# Patient Record
Sex: Male | Born: 1935 | Race: White | Hispanic: No | Smoking: Current every day smoker
Health system: Southern US, Community
[De-identification: ages and names within clinical notes are randomized; demographics above are authoritative.]

## PROBLEM LIST (undated history)

## (undated) DIAGNOSIS — L4050 Arthropathic psoriasis, unspecified: Secondary | ICD-10-CM

## (undated) DIAGNOSIS — E785 Hyperlipidemia, unspecified: Secondary | ICD-10-CM

## (undated) DIAGNOSIS — J449 Chronic obstructive pulmonary disease, unspecified: Secondary | ICD-10-CM

## (undated) DIAGNOSIS — L409 Psoriasis, unspecified: Secondary | ICD-10-CM

## (undated) DIAGNOSIS — I10 Essential (primary) hypertension: Secondary | ICD-10-CM

## (undated) DIAGNOSIS — I2510 Atherosclerotic heart disease of native coronary artery without angina pectoris: Secondary | ICD-10-CM

## (undated) HISTORY — PX: TONSILLECTOMY: SUR1361

---

## 2015-05-23 ENCOUNTER — Encounter (HOSPITAL_COMMUNITY)
Admission: AD | Disposition: A | Discharge status: Home / Self Care | Payer: Self-pay | Source: Other Acute Inpatient Hospital | Attending: Interventional Cardiology

## 2015-05-23 ENCOUNTER — Inpatient Hospital Stay (HOSPITAL_COMMUNITY)
Admission: AD | Admit: 2015-05-23 | Discharge: 2015-05-24 | DRG: 281 | Disposition: A | Discharge status: Home / Self Care | Payer: Medicare Other | Source: Other Acute Inpatient Hospital | Attending: Interventional Cardiology | Admitting: Interventional Cardiology

## 2015-05-23 ENCOUNTER — Encounter (HOSPITAL_COMMUNITY): Payer: Self-pay | Admitting: Interventional Cardiology

## 2015-05-23 DIAGNOSIS — L409 Psoriasis, unspecified: Secondary | ICD-10-CM | POA: Diagnosis not present

## 2015-05-23 DIAGNOSIS — E785 Hyperlipidemia, unspecified: Secondary | ICD-10-CM | POA: Diagnosis not present

## 2015-05-23 DIAGNOSIS — F17210 Nicotine dependence, cigarettes, uncomplicated: Secondary | ICD-10-CM | POA: Diagnosis present

## 2015-05-23 DIAGNOSIS — I2510 Atherosclerotic heart disease of native coronary artery without angina pectoris: Secondary | ICD-10-CM | POA: Diagnosis not present

## 2015-05-23 DIAGNOSIS — I214 Non-ST elevation (NSTEMI) myocardial infarction: Principal | ICD-10-CM

## 2015-05-23 DIAGNOSIS — J441 Chronic obstructive pulmonary disease with (acute) exacerbation: Secondary | ICD-10-CM | POA: Diagnosis not present

## 2015-05-23 DIAGNOSIS — R079 Chest pain, unspecified: Secondary | ICD-10-CM | POA: Diagnosis present

## 2015-05-23 DIAGNOSIS — M549 Dorsalgia, unspecified: Secondary | ICD-10-CM

## 2015-05-23 DIAGNOSIS — M1990 Unspecified osteoarthritis, unspecified site: Secondary | ICD-10-CM | POA: Diagnosis not present

## 2015-05-23 DIAGNOSIS — J449 Chronic obstructive pulmonary disease, unspecified: Secondary | ICD-10-CM

## 2015-05-23 DIAGNOSIS — F17200 Nicotine dependence, unspecified, uncomplicated: Secondary | ICD-10-CM

## 2015-05-23 DIAGNOSIS — I10 Essential (primary) hypertension: Secondary | ICD-10-CM | POA: Diagnosis not present

## 2015-05-23 HISTORY — DX: Hyperlipidemia, unspecified: E78.5

## 2015-05-23 HISTORY — DX: Arthropathic psoriasis, unspecified: L40.50

## 2015-05-23 HISTORY — DX: Atherosclerotic heart disease of native coronary artery without angina pectoris: I25.10

## 2015-05-23 HISTORY — DX: Psoriasis, unspecified: L40.9

## 2015-05-23 HISTORY — DX: Chronic obstructive pulmonary disease, unspecified: J44.9

## 2015-05-23 HISTORY — PX: CARDIAC CATHETERIZATION: SHX172

## 2015-05-23 LAB — TSH: TSH: 1.213 u[IU]/mL (ref 0.350–4.500)

## 2015-05-23 LAB — CREATININE, SERUM
Creatinine, Ser: 1.44 mg/dL — ABNORMAL HIGH (ref 0.61–1.24)
GFR calc Af Amer: 51 mL/min — ABNORMAL LOW (ref >60)
GFR calc non Af Amer: 44 mL/min — ABNORMAL LOW (ref >60)

## 2015-05-23 LAB — CBC
HEMATOCRIT: 40.6 % (ref 39.0–52.0)
HEMOGLOBIN: 13.7 g/dL (ref 13.0–17.0)
MCH: 32.2 pg (ref 26.0–34.0)
MCHC: 33.7 g/dL (ref 30.0–36.0)
MCV: 95.3 fL (ref 78.0–100.0)
Platelets: 130 10*3/uL — ABNORMAL LOW (ref 150–400)
RBC: 4.26 MIL/uL (ref 4.22–5.81)
RDW: 12.7 % (ref 11.5–15.5)
WBC: 6.7 10*3/uL (ref 4.0–10.5)

## 2015-05-23 LAB — TROPONIN I: Troponin I: 3.5 ng/mL (ref <0.031)

## 2015-05-23 LAB — BRAIN NATRIURETIC PEPTIDE: B Natriuretic Peptide: 437 pg/mL — ABNORMAL HIGH (ref 0.0–100.0)

## 2015-05-23 SURGERY — LEFT HEART CATH AND CORONARY ANGIOGRAPHY

## 2015-05-23 MED ORDER — HEART ATTACK BOUNCING BOOK
Freq: Once | Status: AC
  Administered 2015-05-23: 21:00:00
  Filled 2015-05-23: qty 1

## 2015-05-23 MED ORDER — ASPIRIN 81 MG PO CHEW: 324.0000 mg | CHEWABLE_TABLET | ORAL | Status: AC

## 2015-05-23 MED ORDER — VERAPAMIL HCL 2.5 MG/ML IV SOLN
INTRAVENOUS | Status: AC
  Filled 2015-05-23: qty 2

## 2015-05-23 MED ORDER — ASPIRIN EC 81 MG PO TBEC
81.0000 mg | DELAYED_RELEASE_TABLET | Freq: Every day | ORAL | Status: DC
  Administered 2015-05-24: 12:00:00 81 mg via ORAL
  Filled 2015-05-23: qty 1

## 2015-05-23 MED ORDER — NICOTINE 14 MG/24HR TD PT24
14.0000 mg | MEDICATED_PATCH | Freq: Every day | TRANSDERMAL | Status: DC
  Administered 2015-05-23: 22:00:00 14 mg via TRANSDERMAL
  Filled 2015-05-23: qty 1

## 2015-05-23 MED ORDER — SODIUM CHLORIDE 0.9 % IJ SOLN: 3.0000 mL | INTRAMUSCULAR | Status: DC | PRN

## 2015-05-23 MED ORDER — ASPIRIN 81 MG PO CHEW: 81.0000 mg | CHEWABLE_TABLET | Freq: Every day | ORAL | Status: DC

## 2015-05-23 MED ORDER — FENTANYL CITRATE (PF) 100 MCG/2ML IJ SOLN
INTRAMUSCULAR | Status: AC
  Filled 2015-05-23: qty 4

## 2015-05-23 MED ORDER — SODIUM CHLORIDE 0.9 % IV SOLN
250.0000 mL | INTRAVENOUS | Status: DC | PRN
Start: 2015-05-23 — End: 2015-05-23

## 2015-05-23 MED ORDER — HEPARIN SODIUM (PORCINE) 1000 UNIT/ML IJ SOLN
INTRAMUSCULAR | Status: DC | PRN
  Administered 2015-05-23: 4500 [IU] via INTRAVENOUS

## 2015-05-23 MED ORDER — SODIUM CHLORIDE 0.9 % IJ SOLN: 3.0000 mL | Freq: Two times a day (BID) | INTRAMUSCULAR | Status: DC

## 2015-05-23 MED ORDER — MORPHINE SULFATE (PF) 2 MG/ML IV SOLN: 2.0000 mg | INTRAVENOUS | Status: DC | PRN

## 2015-05-23 MED ORDER — ONDANSETRON HCL 4 MG/2ML IJ SOLN: 4.0000 mg | Freq: Four times a day (QID) | INTRAMUSCULAR | Status: DC | PRN

## 2015-05-23 MED ORDER — ACETAMINOPHEN 325 MG PO TABS: 650.0000 mg | ORAL_TABLET | ORAL | Status: DC | PRN

## 2015-05-23 MED ORDER — IOHEXOL 350 MG/ML SOLN
INTRAVENOUS | Status: DC | PRN
  Administered 2015-05-23: 100 mL via INTRA_ARTERIAL

## 2015-05-23 MED ORDER — ACTIVE PARTNERSHIP FOR HEALTH OF YOUR HEART BOOK
Freq: Once | Status: AC
  Administered 2015-05-23: 21:00:00
  Filled 2015-05-23: qty 1

## 2015-05-23 MED ORDER — ASPIRIN 300 MG RE SUPP
300.0000 mg | RECTAL | Status: AC
  Filled 2015-05-23: qty 1

## 2015-05-23 MED ORDER — SODIUM CHLORIDE 0.9 % IV SOLN
INTRAVENOUS | Status: DC | PRN
  Administered 2015-05-23: 87 mL/h via INTRAVENOUS

## 2015-05-23 MED ORDER — HEPARIN SODIUM (PORCINE) 1000 UNIT/ML IJ SOLN
INTRAMUSCULAR | Status: AC
  Filled 2015-05-23: qty 1

## 2015-05-23 MED ORDER — MIDAZOLAM HCL 2 MG/2ML IJ SOLN
INTRAMUSCULAR | Status: DC | PRN
Start: 2015-05-23 — End: 2015-05-23
  Administered 2015-05-23: 1 mg via INTRAVENOUS

## 2015-05-23 MED ORDER — NITROGLYCERIN 0.4 MG SL SUBL: 0.4000 mg | SUBLINGUAL_TABLET | SUBLINGUAL | Status: DC | PRN

## 2015-05-23 MED ORDER — MIDAZOLAM HCL 2 MG/2ML IJ SOLN
INTRAMUSCULAR | Status: AC
  Filled 2015-05-23: qty 4

## 2015-05-23 MED ORDER — LIDOCAINE HCL (PF) 1 % IJ SOLN
INTRAMUSCULAR | Status: AC
  Filled 2015-05-23: qty 30

## 2015-05-23 MED ORDER — FENTANYL CITRATE (PF) 100 MCG/2ML IJ SOLN
INTRAMUSCULAR | Status: DC | PRN
  Administered 2015-05-23: 50 ug via INTRAVENOUS

## 2015-05-23 MED ORDER — HEPARIN (PORCINE) IN NACL 2-0.9 UNIT/ML-% IJ SOLN
INTRAMUSCULAR | Status: DC | PRN
  Administered 2015-05-23: 17:00:00

## 2015-05-23 MED ORDER — CARVEDILOL 3.125 MG PO TABS
3.1250 mg | ORAL_TABLET | Freq: Two times a day (BID) | ORAL | Status: DC
  Administered 2015-05-23 – 2015-05-24 (×2): 3.125 mg via ORAL
  Filled 2015-05-23 (×2): qty 1

## 2015-05-23 MED ORDER — VERAPAMIL HCL 2.5 MG/ML IV SOLN
INTRAVENOUS | Status: DC | PRN
  Administered 2015-05-23: 16:00:00 via INTRA_ARTERIAL

## 2015-05-23 MED ORDER — HEPARIN (PORCINE) IN NACL 100-0.45 UNIT/ML-% IJ SOLN
1100.0000 [IU]/h | INTRAMUSCULAR | Status: DC
  Administered 2015-05-24: 04:00:00 1100 [IU]/h via INTRAVENOUS
  Filled 2015-05-23: qty 250

## 2015-05-23 MED ORDER — SODIUM CHLORIDE 0.9 % WEIGHT BASED INFUSION
1.0000 mL/kg/h | INTRAVENOUS | Status: AC
  Administered 2015-05-23: 1 mL/kg/h via INTRAVENOUS

## 2015-05-23 MED ORDER — NITROGLYCERIN IN D5W 200-5 MCG/ML-% IV SOLN
10.0000 ug/min | INTRAVENOUS | Status: DC
  Administered 2015-05-23: 19:00:00 10 ug/min via INTRAVENOUS
  Filled 2015-05-23: qty 250

## 2015-05-23 MED ORDER — ATORVASTATIN CALCIUM 40 MG PO TABS
40.0000 mg | ORAL_TABLET | Freq: Every day | ORAL | Status: DC
  Administered 2015-05-23: 18:00:00 40 mg via ORAL
  Filled 2015-05-23: qty 1

## 2015-05-23 SURGICAL SUPPLY — 10 items
CATH INFINITI 5 FR JL3.5 (CATHETERS) ×3 IMPLANT
CATH INFINITI JR4 5F (CATHETERS) ×3 IMPLANT
DEVICE RAD COMP TR BAND LRG (VASCULAR PRODUCTS) ×3 IMPLANT
GLIDESHEATH SLEND A-KIT 6F 22G (SHEATH) ×3 IMPLANT
KIT HEART LEFT (KITS) ×3 IMPLANT
PACK CARDIAC CATHETERIZATION (CUSTOM PROCEDURE TRAY) ×3 IMPLANT
TRANSDUCER W/STOPCOCK (MISCELLANEOUS) ×3 IMPLANT
TUBING CIL FLEX 10 FLL-RA (TUBING) ×3 IMPLANT
WIRE HI TORQ VERSACORE-J 145CM (WIRE) ×3 IMPLANT
WIRE SAFE-T 1.5MM-J .035X260CM (WIRE) ×3 IMPLANT

## 2015-05-23 NOTE — Progress Notes (Signed)
TR BAND REMOVAL  LOCATION:    right radial  DEFLATED PER PROTOCOL:    Yes.    TIME BAND OFF / DRESSING APPLIED:    1930   SITE UPON ARRIVAL:    Level 0  SITE AFTER BAND REMOVAL:    Level 0  REVERSE ALLEN'S TEST:     positive  CIRCULATION SENSATION AND MOVEMENT:    Within Normal Limits   Yes.    COMMENTS:    

## 2015-05-23 NOTE — Progress Notes (Signed)
ANTICOAGULATION CONSULT NOTE - Initial Consult  Pharmacy Consult for Heparin Indication: chest pain/ACS  Allergies not on file  Patient Measurements: Height:  (180.3 cm) Weight: 192 lb (87.091 kg) IBW/kg (Calculated) : 75.3 Heparin Dosing Weight:   Vital Signs: BP: 130/76 mmHg (10/06 1634) Pulse Rate: 0 (10/06 1639)  Labs: No results for input(s): HGB, HCT, PLT, APTT, LABPROT, INR, HEPARINUNFRC, CREATININE, CKTOTAL, CKMB, TROPONINI in the last 72 hours.  CrCl cannot be calculated (Unknown ideal weight.).   Medical History: Past Medical History  Diagnosis Date  . Psoriasis     Present for greater than 20 years  . Hyperlipidemia   . COPD (chronic obstructive pulmonary disease) (HCC)   . Hypertension   . Arthritis with psoriasis (HCC)     Medications:  No prescriptions prior to admission   Scheduled:  . aspirin  81 mg Oral Daily  . sodium chloride  3 mL Intravenous Q12H   Infusions:  . sodium chloride    . nitroGLYCERIN      Assessment: 81yo male with history of HLD, COPD, HTN and psoriasis presents with 2-3 week history of recurring CP. Pharmacy is consulted to dose heparin for ACS/STEMI. Pt underwent cardiac cath and will start heparin 8h after removal of sheath. Removal was noted by nurse at 1930. Hgb 13.7, Plt 130, sCr 1.44, Trop 3.5, BNP 437.  Goal of Therapy:  Heparin level 0.3-0.7 units/ml Monitor platelets by anticoagulation protocol: Yes   Plan:  Start heparin infusion at 1100 units/hr Check anti-Xa level in 8 hours and daily while on heparin Continue to monitor H&H and platelets  Arlean Hopping. Newman Pies, PharmD Clinical Pharmacist Pager [phone] 05/23/2015,5:09 PM

## 2015-05-23 NOTE — H&P (Signed)
David Velasquez is a 81 y.o. male  Admit Date: 05/23/2015 Referring Physician: Sherril Croon Primary Cardiologist:: Verdis Prime Chief complaint / reason for admission: Recurring chest pain  HPI: 81 year old gentleman with a 2 to three-week history of recurring episodes of chest pressure. Episodes occur spontaneously and previously been lasting up to 15-20 minutes. Today the discomfort started and felt like heartburn and would not go away. The discomfort lasted greater than an hour. Over the past 24 hours he has had at least 3 prolonged episodes of discomfort.    PMH:    Past Medical History  Diagnosis Date  . Psoriasis     Present for greater than 20 years  . Hyperlipidemia   . COPD (chronic obstructive pulmonary disease) (HCC)   . Hypertension   . Arthritis with psoriasis (HCC)     PSH:    Past Surgical History  Procedure Laterality Date  . Tonsillectomy     ALLERGIES:   Review of patient's allergies indicates not on file. Prior to Admit Meds:   No prescriptions prior to admission   Family HX:    Family History  Problem Relation Age of Onset  . Cancer Mother   . Coronary artery disease Father    Social HX:    Social History   Social History  . Marital Status: Unknown    Spouse Name: N/A  . Number of Children: N/A  . Years of Education: N/A   Occupational History  . Not on file.   Social History Main Topics  . Smoking status: Current Every Day Smoker -- 1.50 packs/day for 60 years    Types: Cigarettes  . Smokeless tobacco: Not on file  . Alcohol Use: Yes     Comment: On occasion but not on a daily basis  . Drug Use: No  . Sexual Activity: No   Other Topics Concern  . Not on file   Social History Narrative  . No narrative on file     ROS difficulty with ambulation due to diffuse arthritis. No history of GI or GU bleeding. Appetite is been stable. Weight is been stable. There've been no neurological complaints. He denies claudication.  Physical Exam: BP  147/60, heart rate 80, respiratory rate 16 and nonlabored, afebrile  SpO2 98 %.    Chronically ill-appearing elderly gentleman lying flat on the hospital gurney in no acute distress.  Skin is warm and dry. No nail bed cyanosis is noted. Neck veins not distended. No carotid bruits are heard. Carotid upstroke is 2+ bilateral. Chest is clear to auscultation and percussion. Cardiac exam reveals no gallop, rub, click, murmur. Heart sounds are distant. Abdomen is soft. Bowel sounds are normal. No pulsatile masses are noted. Bowel sounds are 2+. Pulses are 2+ and symmetric in the carotid, radials, femorals bilaterally, and posterior tibials are 1+ bilateral. Unable to feel dorsalis pedis pulses. Neurologically the patient is intact. Able to ambulate. No focal motor deficits are noted.  Labs:  [facility] a creatinine of 1.39, calcium of 9.1, potassium of 4.0, troponin of 0.74, normal white blood cell count of 7500. Hemoglobin is 14.7. Platelet count 147,000.   Radiology:  Checks x-ray done earlier today at [facility] demonstrates no evidence of edema or consolidation. Arteriosclerotic calcification is noted in the aortic arch. Heart size and pulmonary vascularity were felt to be normal.   EKG:  The EKG performed today at 11:05 AM demonstrates an anteroseptal infarct of uncertain age, probably  recent. The precordial and lateral T wave inversions noted.  ASSESSMENT:  1. Recent anteroseptal Q wave myocardial infarction. Recurring episodes of chest pain and elevated troponin raises the question of additional myocardium at risk or other territories with significant obstruction. 2. Tobacco user with COPD 3. Psoriasis 4. Hyperlipidemia 5. Hypertension on chronic therapy with amlodipine and benazepril followed by Dr. Sherril Croon.  Plan:  1. Coronary artery disease with an acute coronary syndrome ongoing. Pain is recent was this morning. Mild  elevation in troponins. With this scenario, the patient was counseled to undergo heart catheterization which may include left left heart evaluation, coronary angiography, and possible percutaneous coronary intervention with stent implantation. The procedural risks and benefits were discussed in detail. The risks discussed included death, stroke, myocardial infarction, life-threatening bleeding, limb ischemia, kidney injury, allergy, and possible emergency cardiac surgery. The risk of these significant complications occurred less than 1% of the time. After discussion, the patient has agreed to proceed. 2. Plan to institute aspirin and anticoagulation therapy. 3. Add beta blocker therapy 4. High intensity statin therapy 5. Further management based upon anatomy may include percutaneous coronary intervention if appropriate.   Lesleigh Noe 05/23/2015 3:50 PM

## 2015-05-24 ENCOUNTER — Other Ambulatory Visit (HOSPITAL_COMMUNITY): Payer: Medicare Other

## 2015-05-24 ENCOUNTER — Encounter (HOSPITAL_COMMUNITY): Payer: Self-pay | Admitting: Interventional Cardiology

## 2015-05-24 DIAGNOSIS — I5022 Chronic systolic (congestive) heart failure: Secondary | ICD-10-CM | POA: Diagnosis not present

## 2015-05-24 DIAGNOSIS — I10 Essential (primary) hypertension: Secondary | ICD-10-CM | POA: Diagnosis present

## 2015-05-24 DIAGNOSIS — I214 Non-ST elevation (NSTEMI) myocardial infarction: Secondary | ICD-10-CM | POA: Diagnosis present

## 2015-05-24 DIAGNOSIS — R079 Chest pain, unspecified: Secondary | ICD-10-CM | POA: Diagnosis present

## 2015-05-24 DIAGNOSIS — E785 Hyperlipidemia, unspecified: Secondary | ICD-10-CM | POA: Diagnosis present

## 2015-05-24 DIAGNOSIS — M1990 Unspecified osteoarthritis, unspecified site: Secondary | ICD-10-CM | POA: Diagnosis present

## 2015-05-24 DIAGNOSIS — I2510 Atherosclerotic heart disease of native coronary artery without angina pectoris: Secondary | ICD-10-CM | POA: Diagnosis present

## 2015-05-24 DIAGNOSIS — L409 Psoriasis, unspecified: Secondary | ICD-10-CM | POA: Diagnosis present

## 2015-05-24 DIAGNOSIS — J441 Chronic obstructive pulmonary disease with (acute) exacerbation: Secondary | ICD-10-CM | POA: Diagnosis present

## 2015-05-24 DIAGNOSIS — F17210 Nicotine dependence, cigarettes, uncomplicated: Secondary | ICD-10-CM | POA: Diagnosis present

## 2015-05-24 LAB — BASIC METABOLIC PANEL
ANION GAP: 8 (ref 5–15)
BUN: 15 mg/dL (ref 6–20)
CHLORIDE: 102 mmol/L (ref 101–111)
CO2: 27 mmol/L (ref 22–32)
Calcium: 8.8 mg/dL — ABNORMAL LOW (ref 8.9–10.3)
Creatinine, Ser: 1.42 mg/dL — ABNORMAL HIGH (ref 0.61–1.24)
GFR calc non Af Amer: 44 mL/min — ABNORMAL LOW (ref >60)
GFR, EST AFRICAN AMERICAN: 52 mL/min — AB (ref >60)
Glucose, Bld: 100 mg/dL — ABNORMAL HIGH (ref 65–99)
Potassium: 3.9 mmol/L (ref 3.5–5.1)
Sodium: 137 mmol/L (ref 135–145)

## 2015-05-24 LAB — LIPID PANEL
Cholesterol: 142 mg/dL (ref 0–200)
HDL: 35 mg/dL — AB (ref >40)
LDL CALC: 96 mg/dL (ref 0–99)
TRIGLYCERIDES: 53 mg/dL (ref <150)
Total CHOL/HDL Ratio: 4.1 RATIO
VLDL: 11 mg/dL (ref 0–40)

## 2015-05-24 LAB — CBC
HEMATOCRIT: 40 % (ref 39.0–52.0)
HEMOGLOBIN: 13.4 g/dL (ref 13.0–17.0)
MCH: 32.1 pg (ref 26.0–34.0)
MCHC: 33.5 g/dL (ref 30.0–36.0)
MCV: 95.9 fL (ref 78.0–100.0)
Platelets: 130 10*3/uL — ABNORMAL LOW (ref 150–400)
RBC: 4.17 MIL/uL — AB (ref 4.22–5.81)
RDW: 12.8 % (ref 11.5–15.5)
WBC: 6.7 10*3/uL (ref 4.0–10.5)

## 2015-05-24 LAB — TROPONIN I
Troponin I: 4.13 ng/mL (ref <0.031)
Troponin I: 3.63 ng/mL (ref <0.031)

## 2015-05-24 MED ORDER — ATORVASTATIN CALCIUM 40 MG PO TABS: 40.0000 mg | ORAL_TABLET | Freq: Every day | ORAL | Status: DC

## 2015-05-24 MED ORDER — LISINOPRIL 5 MG PO TABS
2.5000 mg | ORAL_TABLET | Freq: Every day | ORAL | Status: DC
  Administered 2015-05-24: 12:00:00 2.5 mg via ORAL
  Filled 2015-05-24: qty 1

## 2015-05-24 MED ORDER — LISINOPRIL 2.5 MG PO TABS: 2.5000 mg | ORAL_TABLET | Freq: Every day | ORAL | Status: DC

## 2015-05-24 MED ORDER — CLOPIDOGREL BISULFATE 75 MG PO TABS: 75.0000 mg | ORAL_TABLET | Freq: Every day | ORAL | Status: DC

## 2015-05-24 MED ORDER — NICOTINE 14 MG/24HR TD PT24: 14.0000 mg | MEDICATED_PATCH | Freq: Every day | TRANSDERMAL | Status: DC

## 2015-05-24 MED ORDER — NITROGLYCERIN 0.4 MG SL SUBL: 0.4000 mg | SUBLINGUAL_TABLET | SUBLINGUAL | Status: AC | PRN

## 2015-05-24 MED ORDER — ASPIRIN 81 MG PO TBEC: 81.0000 mg | DELAYED_RELEASE_TABLET | Freq: Every day | ORAL | Status: AC

## 2015-05-24 MED ORDER — CLOPIDOGREL BISULFATE 75 MG PO TABS
75.0000 mg | ORAL_TABLET | Freq: Every day | ORAL | Status: DC
  Administered 2015-05-24: 12:00:00 75 mg via ORAL
  Filled 2015-05-24: qty 1

## 2015-05-24 MED ORDER — CARVEDILOL 3.125 MG PO TABS: 3.1250 mg | ORAL_TABLET | Freq: Two times a day (BID) | ORAL | Status: DC

## 2015-05-24 NOTE — Progress Notes (Signed)
CARDIAC REHAB PHASE I   PRE:  Rate/Rhythm: 57 SB    BP: sitting 131/60    SaO2:   MODE:  Ambulation: 150 ft   POST:  Rate/Rhythm: 81 SR    BP: sitting 139/68     SaO2:   Pt able to ambulate 150 ft with his cane, supervision assist x2. Pt deals with arthritis and has limited walking ability. Denied chest pressure, VSS. To recliner. Discussed NTG use, daily wts and low sodium with pt. His main goal is to be home with his dog and stay out of the hospital. He has Mi book. Pt n/a for CRPII due to lack of revascularization and prob need for palliative. [phone]   Harriet Masson CES, ACSM 05/24/2015 10:19 AM

## 2015-05-24 NOTE — Progress Notes (Signed)
Patient Name: David Velasquez Date of Encounter: 05/24/2015  Primary Cardiologist: Dr. Katrinka Blazing   Principal Problem:   NSTEMI (non-ST elevated myocardial infarction) [facility]) Active Problems:   COPD exacerbation (HCC)   Essential hypertension   Tobacco use disorder   Back pain   Chest pain    SUBJECTIVE  Denies any CP since [facility]. Has chronic SOB due to COPD  CURRENT MEDS . aspirin EC  81 mg Oral Daily  . atorvastatin  40 mg Oral q1800  . carvedilol  3.125 mg Oral BID WC  . nicotine  14 mg Transdermal Daily    OBJECTIVE  Filed Vitals:   05/23/15 2200 05/24/15 0000 05/24/15 0200 05/24/15 0420  BP: 112/51 125/58 120/50 123/46  Pulse:      Temp:    97.9 F (36.6 C)  TempSrc:    Oral  Resp: Height:      Weight:    181 lb (82.1 kg)  SpO2: 96% 98% 96% 96%    Intake/Output Summary (Last 24 hours) at 05/24/15 0659 Last data filed at 05/24/15 0651  Gross per 24 hour  Intake  447.8 ml  Output    275 ml  Net  172.8 ml   Filed Weights   05/23/15 1700 05/24/15 0420  Weight: 192 lb (87.091 kg) 181 lb (82.1 kg)    PHYSICAL EXAM  General: Pleasant, NAD. Neuro: Alert and oriented X 3. Moves all extremities spontaneously. Psych: Normal affect. HEENT:  Normal  Neck: Supple without bruits or JVD. Lungs:  Resp regular and unlabored, CTA. Heart: RRR no s3, s4, or murmurs. R radial cath site stable, good distal pulse Abdomen: Soft, non-tender, non-distended, BS + x 4.  Extremities: No clubbing, cyanosis or edema. DP/PT/Radials 2+ and equal bilaterally.  Accessory Clinical Findings  CBC  Recent Labs  05/23/15 1755 05/24/15 0538  WBC 6.7 6.7  HGB 13.7 13.4  HCT 40.6 40.0  MCV 95.3 95.9  PLT 130* 130*   Basic Metabolic Panel  Recent Labs  05/23/15 1755 05/24/15 0538  NA  --  137  K  --  3.9  CL  --  102  CO2  --  27  GLUCOSE  --  100*  BUN  --  15  CREATININE 1.44* 1.42*  CALCIUM  --  8.8*   Cardiac Enzymes  Recent Labs  05/23/15 1755 05/23/15 2308 05/24/15 0538  TROPONINI 3.50* 4.13* 3.63*   Fasting Lipid Panel  Recent Labs  05/24/15 0538  CHOL 142  HDL 35*  LDLCALC 96  TRIG 53  CHOLHDL 4.1   Thyroid Function Tests  Recent Labs  05/23/15 1755  TSH 1.213    TELE NSR without significant ventricular ectopy    ECG  NSR with TWI in anterolateral leads  Echocardiogram  pending    Cath 05/23/2015  Conclusion    1. Prox RCA lesion, 85% stenosed. 2. Dist RCA lesion, 70% stenosed. 3. Mid RCA to Dist RCA lesion, 50% stenosed. 4. Ramus-1 lesion, 80% stenosed. 5. Ramus-2 lesion, 90% stenosed. 6. Dist Cx-1 lesion, 80% stenosed. 7. Dist Cx-2 lesion, 80% stenosed. 8. 2nd Mrg lesion, 70% stenosed. 9. Prox LAD to Mid LAD lesion, 99% stenosed. 10. Ost 1st Diag lesion, 90% stenosed. 11. 1st Diag lesion, 70% stenosed.   Severe, calcified and ectatic, three-vessel coronary disease with subtotal occlusion of the proximal to mid LAD, high-grade obstruction first diagonal, moderately severe large second obtuse marginal, significant distal circumflex, and high-grade proximal RCA with  moderate to severe distal disease.  Moderately severe left ventricular dysfunction with regional wall motion abnormality involving the mid anterior to inferoapical segment. Estimated ejection fraction 35%.   RECOMMENDATIONS:   IV heparin  IV nitroglycerin  Beta blocker as tolerated  Aspirin  Surgical revascularization should be considered. Consult called to TCTS.      Radiology/Studies  No results found.  ASSESSMENT AND PLAN  1. NSTEMI with recurring CP with evidence of recent Q wave infarction  - cath 05/23/2015 severe 3v dx with subtotal occlusion 99% of prox to mid LAD, 80% D1, 80 OM1, 90% OM2, 85% prox RCA. EF 35-45%.   - continue IV heparin, pending echo  - continue ASA, statin, coreg. Continue IV nitro. With prox 99% LAD stenosis, if has recurrent CP, low threshold to transfer to  stepdown. Close monitoring with EKG with CP  - CT surgery consulted, however pt states he will refuse any surgery, however may or may not agree to PCI, will discuss with MD, only alternative is palliative medical therapy vs potential PCI. Per pt, "i am ready to die", adamant that he would not go through with any surgery.  - Still full code, asked the patient code status this morning, discussed CPR and resuscitation, he says there is not point of changing it since he won't stay here is the hospital long enough.    2. HLD 3. HTN 4. Tobacco abuse 5. COPD 6. Possible chronic vs acute renal insufficiency stage III  - Cr 1.4, likely chronic   Signed, Amedeo Plenty Pager: [phone]  I have seen and examined the patient along with Azalee Course PA-C.  I have reviewed the chart, notes and new data.  I agree with PA's note.  Key new complaints: no angina or dyspnea Key examination changes: no clinical HF   PLAN: Discussed his current medical condition and our recommendations for therapy in detail and at length. Under no circumstances will he consider any surgical procedures or even PCI (even if that was a valid option). He repeatedly states that he would rather die at home and is not afraid to die and reports that he has poor QOL due to arthritis, COPD and psoriasis. Dr. Katrinka Blazing came to discuss care options as well. He could not be swayed. He insists on leaving today. He understands the immediate and long term risk of foregoing revascularization. Will treat with DAPT, beta blcokers, ACEi, statin, prn nitrates and arrange f/u in [place].  Thurmon Fair, MD, [facility] [phone] 05/24/2015, 9:17 AM

## 2015-05-24 NOTE — Discharge Instructions (Signed)
No driving for 24 hours. No lifting over 5 lbs for 1 week. No sexual activity for 1 week. Keep procedure site clean & dry. If you notice increased pain, swelling, bleeding or pus, call/return!  You may shower, but no soaking baths/hot tubs/pools for 1 week.   STOP TAKING LOTREL (AMLODIPINE-BENAZEPRIL)

## 2015-05-24 NOTE — Progress Notes (Addendum)
Interventional Cardiology   The patient was interviewed along with Dr. Royann Shivers.  He has surgical anatomy. This was re-discussed. He voiced his very strong opinion that he would not accept coronary bypass surgery as a treatment option. He was less definite about PCI but insistent upon going home even after description of high risk and possible death with medical therapy.  He would not allow any detailed discussion of PCI options.  After clear conversation with the patient, he has chosen conservative medical therapy rather than invasive treatment options.

## 2015-05-24 NOTE — Discharge Summary (Signed)
Discharge Summary   Patient ID: David Velasquez,  MRN: [id number], DOB/AGE: [birth year] 81 y.o.  Admit date: 05/23/2015 Discharge date: 05/24/2015  Primary Care Provider: VYAS,DHRUV B. Primary Cardiologist: Jonita Albee (seen by Dr. Katrinka Blazing during this admission)  Discharge Diagnoses Principal Problem:   NSTEMI (non-ST elevated myocardial infarction) [facility]) Active Problems:   COPD exacerbation (HCC)   Essential hypertension   Tobacco use disorder   Back pain   Chest pain   Allergies No Known Allergies  Procedures  Cardiac catheterization 05/24/2015 Conclusion    1. Prox RCA lesion, 85% stenosed. 2. Dist RCA lesion, 70% stenosed. 3. Mid RCA to Dist RCA lesion, 50% stenosed. 4. Ramus-1 lesion, 80% stenosed. 5. Ramus-2 lesion, 90% stenosed. 6. Dist Cx-1 lesion, 80% stenosed. 7. Dist Cx-2 lesion, 80% stenosed. 8. 2nd Mrg lesion, 70% stenosed. 9. Prox LAD to Mid LAD lesion, 99% stenosed. 10. Ost 1st Diag lesion, 90% stenosed. 11. 1st Diag lesion, 70% stenosed.   Severe, calcified and ectatic, three-vessel coronary disease with subtotal occlusion of the proximal to mid LAD, high-grade obstruction first diagonal, moderately severe large second obtuse marginal, significant distal circumflex, and high-grade proximal RCA with moderate to severe distal disease.  Moderately severe left ventricular dysfunction with regional wall motion abnormality involving the mid anterior to inferoapical segment. Estimated ejection fraction 35%.   RECOMMENDATIONS:   IV heparin  IV nitroglycerin  Beta blocker as tolerated  Aspirin  Surgical revascularization should be considered. Consult called to TCTS.      Hospital Course  The patient is an 81 year old male with PMH of tobacco abuse, COPD, hypertension and hyperlipidemia who has been having recurrent episode of chest pain for the past 2-3 weeks. EKG showed anteroseptal Q waves concerning for myocardial infarction. After discussing the  options, he agreed to undergo diagnostic cardiac catheterization.  He underwent a scheduled study on 05/23/2015 which showed severe 3v dx with subtotal occlusion 99% of prox to mid LAD, 80% D1, 80 OM1, 90% OM2, 85% prox RCA. EF 35-45%. Echocardiogram is currently pending. Multiple people include Dr. Royann Shivers, me, and Dr. Katrinka Blazing also spoke to the patient, however he refused to consider any surgery or event PCI. He insisted on going home. Immediate and long-term risk for his condition has been discussed with the patient including sudden cardiac death, he understood the risk and states "he is ready to die" and again insist on going home. He previously has a follow-up arranged with Dr. Diona Browner in [place] [facility] on 10/20, however given his risk of sudden cardiac death, I have arranged a closer follow-up with Dr. Wyline Mood in recent office early next week. Unfortunately, his prognosis is very poor given his unwillingness to seek further medical therapy. However, we had no choice but to respect his wishes.   His daughter is coming to pick him up, we will give him his plavix if his daughter cannot convince him either.    Discharge Vitals Blood pressure 119/53, pulse 67, temperature 97.8 F (36.6 C), temperature source Oral, resp. rate 18, height  (1.803 m), weight 181 lb (82.1 kg), SpO2 97 %.  Filed Weights   05/23/15 1700 05/24/15 0420  Weight: 192 lb (87.091 kg) 181 lb (82.1 kg)    Labs  CBC  Recent Labs  05/23/15 1755 05/24/15 0538  WBC 6.7 6.7  HGB 13.7 13.4  HCT 40.6 40.0  MCV 95.3 95.9  PLT 130* 130*   Basic Metabolic Panel  Recent Labs  05/23/15 1755 05/24/15 0538  NA  --  137  K  --  3.9  CL  --  102  CO2  --  27  GLUCOSE  --  100*  BUN  --  15  CREATININE 1.44* 1.42*  CALCIUM  --  8.8*   Cardiac Enzymes  Recent Labs  05/23/15 1755 05/23/15 2308 05/24/15 0538  TROPONINI 3.50* 4.13* 3.63*   Fasting Lipid Panel  Recent Labs  05/24/15 0538  CHOL 142  HDL 35*    LDLCALC 96  TRIG 53  CHOLHDL 4.1   Thyroid Function Tests  Recent Labs  05/23/15 1755  TSH 1.213    Disposition  Pt is being discharged home today in good condition.  Follow-up Plans & Appointments      Follow-up Information    Follow up with Dina Rich, MD On 05/28/2015.   Specialty:  Cardiology   Why:  11:20am   Contact information:   [address] [place] [address] [phone]       Discharge Medications    Medication List    STOP taking these medications        amLODipine-benazepril 10-40 MG capsule  Commonly known as:  LOTREL      TAKE these medications        albuterol 108 (90 BASE) MCG/ACT inhaler  Commonly known as:  PROVENTIL HFA;VENTOLIN HFA  Inhale 1 puff into the lungs every 6 (six) hours as needed for wheezing or shortness of breath.     aspirin 81 MG EC tablet  Take 1 tablet (81 mg total) by mouth daily.     atorvastatin 40 MG tablet  Commonly known as:  LIPITOR  Take 1 tablet (40 mg total) by mouth daily at 6 PM.     carvedilol 3.125 MG tablet  Commonly known as:  COREG  Take 1 tablet (3.125 mg total) by mouth 2 (two) times daily with a meal.     clopidogrel 75 MG tablet  Commonly known as:  PLAVIX  Take 1 tablet (75 mg total) by mouth daily.     lisinopril 2.5 MG tablet  Commonly known as:  PRINIVIL,ZESTRIL  Take 1 tablet (2.5 mg total) by mouth daily.     NEXIUM 40 MG capsule  Generic drug:  esomeprazole  Take 40 mg by mouth daily.     nicotine 14 mg/24hr patch  Commonly known as:  NICODERM CQ - dosed in mg/24 hours  Place 1 patch (14 mg total) onto the skin daily.     nitroGLYCERIN 0.4 MG SL tablet  Commonly known as:  NITROSTAT  Place 1 tablet (0.4 mg total) under the tongue every 5 (five) minutes x 3 doses as needed for chest pain.     triamcinolone cream 0.1 %  Commonly known as:  KENALOG  Apply 1 application topically daily as needed. rash         Duration of Discharge Encounter   Greater  than 30 minutes including physician time.  Ramond Dial PA-C Pager: [phone] 05/24/2015, 10:05 AM

## 2015-05-24 NOTE — Progress Notes (Signed)
CM spoke with Selena Batten CM ([facility]) @ [phone] referral made regarding patient disease and condition management education, along with medication management issues for f/u post d/c needs.  Gae Gallop RN,BSN,CM [phone]

## 2015-05-27 ENCOUNTER — Encounter: Payer: Self-pay | Admitting: Physician Assistant

## 2015-05-28 ENCOUNTER — Encounter: Payer: Medicare Other | Admitting: Cardiology

## 2015-05-28 ENCOUNTER — Encounter: Payer: Self-pay | Admitting: Cardiology

## 2015-05-28 NOTE — Progress Notes (Signed)
Patient ID: David Velasquez, male   DOB: 1931/03/02, 81 y.o.   MRN: [id number]     Clinical Summary David Velasquez is a 81 y.o.male seen today for hospital follow up appointment, this is our first visit together.  1. CAD - admit 05/2015 with NSTEMI. Cath showed severe 3 vessel  - patient refused intervention  Past Medical History  Diagnosis Date  . Psoriasis     Present for greater than 20 years  . Hyperlipidemia   . COPD (chronic obstructive pulmonary disease) (HCC)   . Hypertension   . Arthritis with psoriasis (HCC)   . CAD (coronary artery disease)     cath 05/23/2015 3v dx include 99% prox LAD. Pt referred for CT surgery, absolutely refused any surgery, discussed PCI however also refused, adamant about going home     No Known Allergies   Current Outpatient Prescriptions  Medication Sig Dispense Refill  . albuterol (PROVENTIL HFA;VENTOLIN HFA) 108 (90 BASE) MCG/ACT inhaler Inhale 1 puff into the lungs every 6 (six) hours as needed for wheezing or shortness of breath.    Marland Kitchen aspirin EC 81 MG EC tablet Take 1 tablet (81 mg total) by mouth daily.    Marland Kitchen atorvastatin (LIPITOR) 40 MG tablet Take 1 tablet (40 mg total) by mouth daily at 6 PM. 30 tablet 11  . carvedilol (COREG) 3.125 MG tablet Take 1 tablet (3.125 mg total) by mouth 2 (two) times daily with a meal. 60 tablet 5  . clopidogrel (PLAVIX) 75 MG tablet Take 1 tablet (75 mg total) by mouth daily. 30 tablet 11  . lisinopril (PRINIVIL,ZESTRIL) 2.5 MG tablet Take 1 tablet (2.5 mg total) by mouth daily. 30 tablet 11  . NEXIUM 40 MG capsule Take 40 mg by mouth daily.    . nicotine (NICODERM CQ - DOSED IN MG/24 HOURS) 14 mg/24hr patch Place 1 patch (14 mg total) onto the skin daily. 28 patch 0  . nitroGLYCERIN (NITROSTAT) 0.4 MG SL tablet Place 1 tablet (0.4 mg total) under the tongue every 5 (five) minutes x 3 doses as needed for chest pain. 25 tablet 4  . triamcinolone cream (KENALOG) 0.1 % Apply 1 application topically daily as needed.  rash     No current facility-administered medications for this visit.     Past Surgical History  Procedure Laterality Date  . Tonsillectomy    . Cardiac catheterization N/A 05/23/2015    Procedure: Left Heart Cath and Coronary Angiography;  Surgeon: Lyn Records, MD;  Location: [facility] INVASIVE CV LAB;  Service: Cardiovascular;  Laterality: N/A;     No Known Allergies    Family History  Problem Relation Age of Onset  . Cancer Mother   . Coronary artery disease Father      Social History David Velasquez reports that he has been smoking Cigarettes.  He has a 90 pack-year smoking history. He does not have any smokeless tobacco history on file. David Velasquez reports that he drinks alcohol.   Review of Systems CONSTITUTIONAL: No weight loss, fever, chills, weakness or fatigue.  HEENT: Eyes: No visual loss, blurred vision, double vision or yellow sclerae.No hearing loss, sneezing, congestion, runny nose or sore throat.  SKIN: No rash or itching.  CARDIOVASCULAR:  RESPIRATORY: No shortness of breath, cough or sputum.  GASTROINTESTINAL: No anorexia, nausea, vomiting or diarrhea. No abdominal pain or blood.  GENITOURINARY: No burning on urination, no polyuria NEUROLOGICAL: No headache, dizziness, syncope, paralysis, ataxia, numbness or tingling in the extremities. No change in bowel  or bladder control.  MUSCULOSKELETAL: No muscle, back pain, joint pain or stiffness.  LYMPHATICS: No enlarged nodes. No history of splenectomy.  PSYCHIATRIC: No history of depression or anxiety.  ENDOCRINOLOGIC: No reports of sweating, cold or heat intolerance. No polyuria or polydipsia.  Marland Kitchen   Physical Examination There were no vitals filed for this visit. There were no vitals filed for this visit.  Gen: resting comfortably, no acute distress HEENT: no scleral icterus, pupils equal round and reactive, no palptable cervical adenopathy,  CV Resp: Clear to auscultation bilaterally GI: abdomen is soft,  non-tender, non-distended, normal bowel sounds, no hepatosplenomegaly MSK: extremities are warm, no edema.  Skin: warm, no rash Neuro:  no focal deficits Psych: appropriate affect   Diagnostic Studies  05/2015 cath 1. Prox RCA lesion, 85% stenosed. 2. Dist RCA lesion, 70% stenosed. 3. Mid RCA to Dist RCA lesion, 50% stenosed. 4. Ramus-1 lesion, 80% stenosed. 5. Ramus-2 lesion, 90% stenosed. 6. Dist Cx-1 lesion, 80% stenosed. 7. Dist Cx-2 lesion, 80% stenosed. 8. 2nd Mrg lesion, 70% stenosed. 9. Prox LAD to Mid LAD lesion, 99% stenosed. 10. Ost 1st Diag lesion, 90% stenosed. 11. 1st Diag lesion, 70% stenosed.   Severe, calcified and ectatic, three-vessel coronary disease with subtotal occlusion of the proximal to mid LAD, high-grade obstruction first diagonal, moderately severe large second obtuse marginal, significant distal circumflex, and high-grade proximal RCA with moderate to severe distal disease.  Moderately severe left ventricular dysfunction with regional wall motion abnormality involving the mid anterior to inferoapical segment. Estimated ejection fraction 35%.      Assessment and Plan        David Velasquez, M.D., F.A.C.C.

## 2015-06-06 ENCOUNTER — Ambulatory Visit: Payer: Self-pay | Admitting: Cardiology

## 2015-08-08 ENCOUNTER — Ambulatory Visit (INDEPENDENT_AMBULATORY_CARE_PROVIDER_SITE_OTHER): Payer: Medicare Other | Admitting: Cardiovascular Disease

## 2015-08-08 ENCOUNTER — Encounter: Payer: Self-pay | Admitting: Cardiovascular Disease

## 2015-08-08 VITALS — BP 150/68 | HR 57 | Ht 70.0 in | Wt 200.0 lb

## 2015-08-08 DIAGNOSIS — I25118 Atherosclerotic heart disease of native coronary artery with other forms of angina pectoris: Secondary | ICD-10-CM | POA: Diagnosis not present

## 2015-08-08 DIAGNOSIS — I5022 Chronic systolic (congestive) heart failure: Secondary | ICD-10-CM

## 2015-08-08 DIAGNOSIS — I10 Essential (primary) hypertension: Secondary | ICD-10-CM | POA: Diagnosis not present

## 2015-08-08 DIAGNOSIS — E785 Hyperlipidemia, unspecified: Secondary | ICD-10-CM

## 2015-08-08 MED ORDER — LISINOPRIL 5 MG PO TABS: 5.0000 mg | ORAL_TABLET | Freq: Every day | ORAL | Status: DC

## 2015-08-08 NOTE — Progress Notes (Signed)
Patient ID: David KentConnie M Velasquez, male   DOB: 06/23/1931, 81 y.o.   MRN: [id number]      SUBJECTIVE: The patient is an 81 year old male with a PMH of tobacco abuse, COPD, hypertension and hyperlipidemia who had been having recurrent episode of chest pain in 04/2015-05/2015. EKG showed anteroseptal Q waves concerning for myocardial infarction. After discussing the options, he agreed to undergo diagnostic cardiac catheterization.  He underwent a scheduled study on 05/23/2015 which showed severe 3v dx with subtotal occlusion 99% of prox to mid LAD, 80% D1, 80 OM1, 90% OM2, 85% prox RCA. EF 35-45% by LV gram.   Multiple people including Dr. Royann Shiversroitoru, [facility] Meng PA-C, and Dr. Katrinka BlazingSmith also spoke to the patient. However he refused to consider any surgery or even PCI. He insisted on going home. Immediate and long-term risks for his condition were discussed with the patient including sudden cardiac death. He understood the risk and stated "I am ready to die" and again insisted on going home.  He presents for follow up. He is here with his daughter, David Velasquez.  He denies chest pain and leg swelling. He has chronic exertional dyspnea from years of tobacco use and COPD but this has not gotten any worse. He denies dizziness and syncope. His primary complaint relates to weak ankles. He has not had use sublingual nitroglycerin.   Review of Systems: As per "subjective", otherwise negative.  No Known Allergies  Current Outpatient Prescriptions  Medication Sig Dispense Refill  . albuterol (PROVENTIL HFA;VENTOLIN HFA) 108 (90 BASE) MCG/ACT inhaler Inhale 1 puff into the lungs every 6 (six) hours as needed for wheezing or shortness of breath.    Marland Kitchen. aspirin EC 81 MG EC tablet Take 1 tablet (81 mg total) by mouth daily.    Marland Kitchen. atorvastatin (LIPITOR) 40 MG tablet Take 1 tablet (40 mg total) by mouth daily at 6 PM. 30 tablet 11  . carvedilol (COREG) 3.125 MG tablet Take 1 tablet (3.125 mg total) by mouth 2 (two) times daily with a  meal. 60 tablet 5  . clopidogrel (PLAVIX) 75 MG tablet Take 1 tablet (75 mg total) by mouth daily. 30 tablet 11  . lisinopril (PRINIVIL,ZESTRIL) 2.5 MG tablet Take 1 tablet (2.5 mg total) by mouth daily. 30 tablet 11  . nitroGLYCERIN (NITROSTAT) 0.4 MG SL tablet Place 1 tablet (0.4 mg total) under the tongue every 5 (five) minutes x 3 doses as needed for chest pain. 25 tablet 4  . triamcinolone cream (KENALOG) 0.1 % Apply 1 application topically daily as needed. rash     No current facility-administered medications for this visit.    Past Medical History  Diagnosis Date  . Psoriasis     Present for greater than 20 years  . Hyperlipidemia   . COPD (chronic obstructive pulmonary disease) (HCC)   . Hypertension   . Arthritis with psoriasis (HCC)   . CAD (coronary artery disease)     cath 05/23/2015 3v dx include 99% prox LAD. Pt referred for CT surgery, absolutely refused any surgery, discussed PCI however also refused, adamant about going home    Past Surgical History  Procedure Laterality Date  . Tonsillectomy    . Cardiac catheterization N/A 05/23/2015    Procedure: Left Heart Cath and Coronary Angiography;  Surgeon: Lyn RecordsHenry W Smith, MD;  [facility];  Service: Cardiovascular;  Laterality: N/A;    Social History   Social History  . Marital Status: Unknown    Spouse Name: N/A  .  Number of Children: N/A  . Years of Education: N/A   Occupational History  . Not on file.   Social History Main Topics  . Smoking status: Former Smoker -- 1.50 packs/day for 60 years    Types: Cigarettes    Quit date: 06/08/2015  . Smokeless tobacco: Never Used  . Alcohol Use: 0.0 oz/week    0 Standard drinks or equivalent per week     Comment: On occasion but not on a daily basis  . Drug Use: No  . Sexual Activity: No   Other Topics Concern  . Not on file   Social History Narrative     Filed Vitals:   08/08/15 0916  BP: 150/68  Pulse: 57  Height:  (1.778 m)    Weight: 200 lb (90.719 kg)  SpO2: 98%    PHYSICAL EXAM General: NAD HEENT: Normal. Neck: No JVD, no thyromegaly. Lungs: No rales or wheezes. CV: Regular rate and rhythm, normal S1/S2, no S3/S4, no murmur. No pretibial or periankle edema.  Abdomen: Soft, nontender, no distention.  Neurologic: Alert and oriented x 3.  Psych: Normal affect. Skin: Normal. Musculoskeletal: No gross deformities. Extremities: No clubbing or cyanosis.   ECG: Most recent ECG reviewed.      ASSESSMENT AND PLAN: 1. Severe CAD: Prognosis is poor without revascularization, which he is not interested in. Symptomatically stable. Continue ASA, Plavix, Lipitor, Coreg, and ACEI.  2. Essential HTN: Elevated. Increase lisinopril to 5 mg daily.  3. Hyperlipidemia: On Lipitor 40 mg.  4. Chronic systolic heart failure: Euvolemic. No diuretic requirement at present. Continue Coreg and ACEI.  Dispo: f/u 3-4 months.   Prentice Docker, M.D., F.A.C.C.

## 2015-08-08 NOTE — Patient Instructions (Signed)
Your physician recommends that you schedule a follow-up appointment in: 3-4 MONTHS WITH DR. Purvis SheffieldKONESWARAN  Your physician has recommended you make the following change in your medication:   INCREASE LISINOPRIL 5 MG DAILY  Thank you for choosing [facility]!!

## 2015-11-04 ENCOUNTER — Other Ambulatory Visit: Payer: Self-pay

## 2015-11-04 MED ORDER — CARVEDILOL 3.125 MG PO TABS: 3.1250 mg | ORAL_TABLET | Freq: Two times a day (BID) | ORAL | Status: DC

## 2015-11-07 ENCOUNTER — Ambulatory Visit: Payer: Medicare Other | Admitting: Cardiovascular Disease

## 2015-11-27 ENCOUNTER — Ambulatory Visit (INDEPENDENT_AMBULATORY_CARE_PROVIDER_SITE_OTHER): Payer: Medicare Other | Admitting: Cardiovascular Disease

## 2015-11-27 ENCOUNTER — Encounter: Payer: Self-pay | Admitting: Cardiovascular Disease

## 2015-11-27 VITALS — BP 171/85 | HR 60 | Ht 70.0 in | Wt 202.0 lb

## 2015-11-27 DIAGNOSIS — I25118 Atherosclerotic heart disease of native coronary artery with other forms of angina pectoris: Secondary | ICD-10-CM | POA: Diagnosis not present

## 2015-11-27 DIAGNOSIS — I5022 Chronic systolic (congestive) heart failure: Secondary | ICD-10-CM | POA: Diagnosis not present

## 2015-11-27 DIAGNOSIS — E785 Hyperlipidemia, unspecified: Secondary | ICD-10-CM | POA: Diagnosis not present

## 2015-11-27 DIAGNOSIS — Z720 Tobacco use: Secondary | ICD-10-CM

## 2015-11-27 DIAGNOSIS — I10 Essential (primary) hypertension: Secondary | ICD-10-CM

## 2015-11-27 MED ORDER — AMLODIPINE BESYLATE 2.5 MG PO TABS: 2.5000 mg | ORAL_TABLET | Freq: Every day | ORAL | Status: DC

## 2015-11-27 NOTE — Progress Notes (Signed)
Patient ID: David Velasquez, male   DOB: 05/05/1931, 82 y.o.   MRN: [id number]      SUBJECTIVE: The patient presents for follow-up for severe coronary artery disease. I started lisinopril at his last visit for elevated blood pressure but he did not tolerate it.  Coronary angiography on 05/23/2015 which showed severe 3 vessel disease with subtotal occlusion 99% of proximal to mid LAD, 80% D1, 80 OM1, 90% OM2, 85% prox RCA. EF 35-45% by LV gram.   Multiple people including Dr. Royann Shiversroitoru, [facility] Meng PA-C, and Dr. Katrinka BlazingSmith also spoke to the patient. However he refused to consider any surgery or even PCI. He insisted on going home. Immediate and long-term risks for his condition were discussed with the patient including sudden cardiac death. He understood the risk and stated "I am ready to die" and again insisted on going home.  He denies chest pain and leg swelling. He has chronic exertional dyspnea from years of tobacco use and COPD but this has not gotten any worse.   He continues to smoke.  Review of Systems: As per "subjective", otherwise negative.  No Known Allergies  Current Outpatient Prescriptions  Medication Sig Dispense Refill  . albuterol (PROVENTIL HFA;VENTOLIN HFA) 108 (90 BASE) MCG/ACT inhaler Inhale 1 puff into the lungs every 6 (six) hours as needed for wheezing or shortness of breath.    Marland Kitchen. aspirin EC 81 MG EC tablet Take 1 tablet (81 mg total) by mouth daily.    Marland Kitchen. atorvastatin (LIPITOR) 40 MG tablet Take 1 tablet (40 mg total) by mouth daily at 6 PM. 30 tablet 11  . carvedilol (COREG) 3.125 MG tablet Take 1 tablet (3.125 mg total) by mouth 2 (two) times daily with a meal. 60 tablet 6  . clopidogrel (PLAVIX) 75 MG tablet Take 1 tablet (75 mg total) by mouth daily. 30 tablet 11  . co-enzyme Q-10 50 MG capsule Take 50 mg by mouth daily.    . magnesium oxide (MAG-OX) 400 MG tablet Take 400 mg by mouth daily.    . nitroGLYCERIN (NITROSTAT) 0.4 MG SL tablet Place 1 tablet (0.4 mg total)  under the tongue every 5 (five) minutes x 3 doses as needed for chest pain. 25 tablet 4  . Specialty Vitamins Products (PROSTATE PO) Take 1 capsule by mouth daily.    Marland Kitchen. triamcinolone cream (KENALOG) 0.1 % Apply 1 application topically daily as needed. rash     No current facility-administered medications for this visit.    Past Medical History  Diagnosis Date  . Psoriasis     Present for greater than 20 years  . Hyperlipidemia   . COPD (chronic obstructive pulmonary disease) (HCC)   . Hypertension   . Arthritis with psoriasis (HCC)   . CAD (coronary artery disease)     cath 05/23/2015 3v dx include 99% prox LAD. Pt referred for CT surgery, absolutely refused any surgery, discussed PCI however also refused, adamant about going home    Past Surgical History  Procedure Laterality Date  . Tonsillectomy    . Cardiac catheterization N/A 05/23/2015    Procedure: Left Heart Cath and Coronary Angiography;  Surgeon: Lyn RecordsHenry W Smith, MD;  [facility];  Service: Cardiovascular;  Laterality: N/A;    Social History   Social History  . Marital Status: Unknown    Spouse Name: N/A  . Number of Children: N/A  . Years of Education: N/A   Occupational History  . Not on file.   Social  History Main Topics  . Smoking status: Current Every Day Smoker -- 1.50 packs/day for 60 years    Types: Cigarettes    Start date: 10/20/1947  . Smokeless tobacco: Never Used  . Alcohol Use: 0.0 oz/week    0 Standard drinks or equivalent per week     Comment: On occasion but not on a daily basis  . Drug Use: No  . Sexual Activity: No   Other Topics Concern  . Not on file   Social History Narrative     Filed Vitals:   11/27/15 1446  BP: 171/85  Pulse: 60  Height:  (1.778 m)  Weight: 202 lb (91.627 kg)    PHYSICAL EXAM General: NAD HEENT: Normal. Neck: No JVD, no thyromegaly. Lungs: No rales or wheezes. CV: Regular rate and rhythm, normal S1/S2, no S3/S4, no murmur. No  pretibial or periankle edema. Abdomen: Soft, nontender, no distention.  Neurologic: Alert and oriented x 3.  Psych: Flat affect. Skin: Normal. Extremities: No clubbing or cyanosis.   ECG: Most recent ECG reviewed.    ASSESSMENT AND PLAN: 1. Severe CAD: Prognosis is poor without revascularization, which he is not interested in. Symptomatically stable. Continue ASA, Plavix, Lipitor, and Coreg. Will start amlodipine 2.5 mg to control BP.  2. Essential HTN: Elevated. I started lisinopril 5 mg daily but he did not tolerate it. Will start amlodipine 2.5 mg.  3. Hyperlipidemia: On Lipitor 40 mg.  4. Chronic systolic heart failure: Euvolemic. No diuretic requirement at present. Continue Coreg.  Dispo: f/u 6 months.   Prentice Docker, M.D., F.A.C.C.

## 2015-11-27 NOTE — Patient Instructions (Signed)
Your physician has recommended you make the following change in your medication:  Start amlodipine 2.5 mg daily. Continue all other medications the same. Your physician recommends that you schedule a follow-up appointment in: 6 months. You will receive a reminder letter in the mail in about 4 months reminding you to call and schedule your appointment. If you don't receive this letter, please contact our office.

## 2016-05-14 NOTE — Progress Notes (Signed)
This encounter was created in error - please disregard.

## 2016-05-18 ENCOUNTER — Other Ambulatory Visit: Payer: Self-pay | Admitting: Physician Assistant

## 2016-05-19 NOTE — Telephone Encounter (Signed)
Review for refill. 

## 2016-06-17 ENCOUNTER — Other Ambulatory Visit: Payer: Self-pay | Admitting: Cardiovascular Disease

## 2016-07-13 ENCOUNTER — Inpatient Hospital Stay (HOSPITAL_BASED_OUTPATIENT_CLINIC_OR_DEPARTMENT_OTHER): Payer: Medicare Other

## 2016-07-13 ENCOUNTER — Emergency Department (HOSPITAL_COMMUNITY): Payer: Medicare Other

## 2016-07-13 ENCOUNTER — Encounter (HOSPITAL_COMMUNITY): Payer: Self-pay | Admitting: *Deleted

## 2016-07-13 ENCOUNTER — Inpatient Hospital Stay (HOSPITAL_COMMUNITY)
Admission: EM | Admit: 2016-07-13 | Discharge: 2016-07-13 | DRG: 309 | Discharge status: Against Medical Advice | Payer: Medicare Other | Attending: Internal Medicine | Admitting: Internal Medicine

## 2016-07-13 DIAGNOSIS — R079 Chest pain, unspecified: Secondary | ICD-10-CM | POA: Diagnosis present

## 2016-07-13 DIAGNOSIS — N183 Chronic kidney disease, stage 3 unspecified: Secondary | ICD-10-CM | POA: Diagnosis present

## 2016-07-13 DIAGNOSIS — Z7982 Long term (current) use of aspirin: Secondary | ICD-10-CM

## 2016-07-13 DIAGNOSIS — L409 Psoriasis, unspecified: Secondary | ICD-10-CM | POA: Diagnosis present

## 2016-07-13 DIAGNOSIS — R001 Bradycardia, unspecified: Secondary | ICD-10-CM | POA: Diagnosis not present

## 2016-07-13 DIAGNOSIS — I4892 Unspecified atrial flutter: Principal | ICD-10-CM | POA: Diagnosis present

## 2016-07-13 DIAGNOSIS — M1990 Unspecified osteoarthritis, unspecified site: Secondary | ICD-10-CM | POA: Diagnosis present

## 2016-07-13 DIAGNOSIS — E785 Hyperlipidemia, unspecified: Secondary | ICD-10-CM | POA: Diagnosis present

## 2016-07-13 DIAGNOSIS — F17210 Nicotine dependence, cigarettes, uncomplicated: Secondary | ICD-10-CM | POA: Diagnosis present

## 2016-07-13 DIAGNOSIS — I5022 Chronic systolic (congestive) heart failure: Secondary | ICD-10-CM | POA: Diagnosis present

## 2016-07-13 DIAGNOSIS — I10 Essential (primary) hypertension: Secondary | ICD-10-CM | POA: Diagnosis present

## 2016-07-13 DIAGNOSIS — I5032 Chronic diastolic (congestive) heart failure: Secondary | ICD-10-CM | POA: Diagnosis present

## 2016-07-13 DIAGNOSIS — J449 Chronic obstructive pulmonary disease, unspecified: Secondary | ICD-10-CM | POA: Diagnosis present

## 2016-07-13 DIAGNOSIS — Z79899 Other long term (current) drug therapy: Secondary | ICD-10-CM | POA: Diagnosis not present

## 2016-07-13 DIAGNOSIS — I208 Other forms of angina pectoris: Secondary | ICD-10-CM

## 2016-07-13 DIAGNOSIS — I4891 Unspecified atrial fibrillation: Secondary | ICD-10-CM

## 2016-07-13 DIAGNOSIS — I252 Old myocardial infarction: Secondary | ICD-10-CM | POA: Diagnosis not present

## 2016-07-13 DIAGNOSIS — I130 Hypertensive heart and chronic kidney disease with heart failure and stage 1 through stage 4 chronic kidney disease, or unspecified chronic kidney disease: Secondary | ICD-10-CM | POA: Diagnosis present

## 2016-07-13 DIAGNOSIS — J439 Emphysema, unspecified: Secondary | ICD-10-CM | POA: Diagnosis present

## 2016-07-13 DIAGNOSIS — Z8249 Family history of ischemic heart disease and other diseases of the circulatory system: Secondary | ICD-10-CM | POA: Diagnosis not present

## 2016-07-13 DIAGNOSIS — I2510 Atherosclerotic heart disease of native coronary artery without angina pectoris: Secondary | ICD-10-CM | POA: Diagnosis present

## 2016-07-13 HISTORY — DX: Essential (primary) hypertension: I10

## 2016-07-13 LAB — BASIC METABOLIC PANEL
Anion gap: 7 (ref 5–15)
BUN: 29 mg/dL — AB (ref 6–20)
CHLORIDE: 106 mmol/L (ref 101–111)
CO2: 23 mmol/L (ref 22–32)
Calcium: 9.4 mg/dL (ref 8.9–10.3)
Creatinine, Ser: 1.71 mg/dL — ABNORMAL HIGH (ref 0.61–1.24)
GFR calc Af Amer: 41 mL/min — ABNORMAL LOW (ref >60)
GFR, EST NON AFRICAN AMERICAN: 35 mL/min — AB (ref >60)
GLUCOSE: 135 mg/dL — AB (ref 65–99)
POTASSIUM: 4.5 mmol/L (ref 3.5–5.1)
Sodium: 136 mmol/L (ref 135–145)

## 2016-07-13 LAB — ECHOCARDIOGRAM COMPLETE
HEIGHTINCHES: 70 in
Weight: 3470.92 oz

## 2016-07-13 LAB — CBC
HEMATOCRIT: 39.2 % (ref 39.0–52.0)
HEMOGLOBIN: 13.1 g/dL (ref 13.0–17.0)
MCH: 31.4 pg (ref 26.0–34.0)
MCHC: 33.4 g/dL (ref 30.0–36.0)
MCV: 94 fL (ref 78.0–100.0)
Platelets: 139 10*3/uL — ABNORMAL LOW (ref 150–400)
RBC: 4.17 MIL/uL — ABNORMAL LOW (ref 4.22–5.81)
RDW: 13 % (ref 11.5–15.5)
WBC: 7.6 10*3/uL (ref 4.0–10.5)

## 2016-07-13 LAB — TROPONIN I
TROPONIN I: 0.05 ng/mL — AB (ref <0.03)
Troponin I: 0.57 ng/mL (ref <0.03)

## 2016-07-13 LAB — T4, FREE: FREE T4: 0.87 ng/dL (ref 0.61–1.12)

## 2016-07-13 LAB — PROTIME-INR
INR: 1.16
PROTHROMBIN TIME: 14.9 s (ref 11.4–15.2)

## 2016-07-13 LAB — TSH: TSH: 1.253 u[IU]/mL (ref 0.350–4.500)

## 2016-07-13 LAB — I-STAT TROPONIN, ED: Troponin i, poc: 0.01 ng/mL (ref 0.00–0.08)

## 2016-07-13 LAB — MRSA PCR SCREENING: MRSA by PCR: NEGATIVE

## 2016-07-13 LAB — APTT: APTT: 33 s (ref 24–36)

## 2016-07-13 MED ORDER — MORPHINE SULFATE (PF) 4 MG/ML IV SOLN
4.0000 mg | Freq: Once | INTRAVENOUS | Status: AC
Start: 2016-07-13 — End: 2016-07-13
  Administered 2016-07-13: 4 mg via INTRAVENOUS
  Filled 2016-07-13: qty 1

## 2016-07-13 MED ORDER — ALBUTEROL SULFATE (2.5 MG/3ML) 0.083% IN NEBU: 3.0000 mL | INHALATION_SOLUTION | RESPIRATORY_TRACT | Status: DC | PRN

## 2016-07-13 MED ORDER — SODIUM CHLORIDE 0.9 % IV SOLN
INTRAVENOUS | Status: AC
  Administered 2016-07-13: 07:00:00 via INTRAVENOUS

## 2016-07-13 MED ORDER — ASPIRIN EC 81 MG PO TBEC
81.0000 mg | DELAYED_RELEASE_TABLET | Freq: Every day | ORAL | Status: DC
  Administered 2016-07-13: 81 mg via ORAL
  Filled 2016-07-13: qty 1

## 2016-07-13 MED ORDER — OFF THE BEAT BOOK
Freq: Once | Status: DC
  Filled 2016-07-13: qty 1

## 2016-07-13 MED ORDER — CLOPIDOGREL BISULFATE 75 MG PO TABS
75.0000 mg | ORAL_TABLET | Freq: Every day | ORAL | Status: DC
  Administered 2016-07-13: 75 mg via ORAL
  Filled 2016-07-13: qty 1

## 2016-07-13 MED ORDER — CARVEDILOL 3.125 MG PO TABS
3.1250 mg | ORAL_TABLET | Freq: Two times a day (BID) | ORAL | Status: DC
  Administered 2016-07-13: 3.125 mg via ORAL
  Filled 2016-07-13: qty 1

## 2016-07-13 MED ORDER — PANTOPRAZOLE SODIUM 40 MG PO TBEC
40.0000 mg | DELAYED_RELEASE_TABLET | Freq: Every day | ORAL | Status: DC
  Administered 2016-07-13: 40 mg via ORAL
  Filled 2016-07-13: qty 1

## 2016-07-13 MED ORDER — ONDANSETRON HCL 4 MG/2ML IJ SOLN: 4.0000 mg | Freq: Four times a day (QID) | INTRAMUSCULAR | Status: DC | PRN

## 2016-07-13 MED ORDER — ACETAMINOPHEN 325 MG PO TABS: 650.0000 mg | ORAL_TABLET | ORAL | Status: DC | PRN

## 2016-07-13 MED ORDER — HEPARIN BOLUS VIA INFUSION
5000.0000 [IU] | Freq: Once | INTRAVENOUS | Status: AC
  Administered 2016-07-13: 5000 [IU] via INTRAVENOUS
  Filled 2016-07-13: qty 5000

## 2016-07-13 MED ORDER — ALPRAZOLAM 0.25 MG PO TABS: 0.2500 mg | ORAL_TABLET | Freq: Two times a day (BID) | ORAL | Status: DC | PRN

## 2016-07-13 MED ORDER — ATROPINE SULFATE 1 MG/ML IJ SOLN
0.5000 mg | INTRAMUSCULAR | Status: DC | PRN
  Filled 2016-07-13: qty 1

## 2016-07-13 MED ORDER — MORPHINE SULFATE (PF) 2 MG/ML IV SOLN
2.0000 mg | Freq: Once | INTRAVENOUS | Status: AC
  Administered 2016-07-13: 2 mg via INTRAVENOUS
  Filled 2016-07-13: qty 1

## 2016-07-13 MED ORDER — DILTIAZEM HCL 100 MG IV SOLR
5.0000 mg/h | Freq: Once | INTRAVENOUS | Status: AC
  Administered 2016-07-13: 5 mg/h via INTRAVENOUS
  Filled 2016-07-13: qty 100

## 2016-07-13 MED ORDER — TAMSULOSIN HCL 0.4 MG PO CAPS
0.4000 mg | ORAL_CAPSULE | Freq: Every day | ORAL | Status: DC
  Administered 2016-07-13: 0.4 mg via ORAL
  Filled 2016-07-13: qty 1

## 2016-07-13 MED ORDER — AMLODIPINE BESYLATE 5 MG PO TABS: 2.5000 mg | ORAL_TABLET | Freq: Every day | ORAL | Status: DC

## 2016-07-13 MED ORDER — HEPARIN (PORCINE) IN NACL 100-0.45 UNIT/ML-% IJ SOLN
1300.0000 [IU]/h | INTRAMUSCULAR | Status: DC
  Administered 2016-07-13: 1300 [IU]/h via INTRAVENOUS
  Filled 2016-07-13: qty 250

## 2016-07-13 MED ORDER — ATORVASTATIN CALCIUM 40 MG PO TABS: 40.0000 mg | ORAL_TABLET | Freq: Every day | ORAL | Status: DC

## 2016-07-13 MED ORDER — DILTIAZEM HCL 100 MG IV SOLR: 5.0000 mg/h | INTRAVENOUS | Status: DC

## 2016-07-13 NOTE — ED Triage Notes (Signed)
Pt c/o mid sternal chest pain that started about two hours  Ago,  denies any associated symptoms, did take 3 nitro at home and 324 aspirin , was given one additional nitro with ems with no change in pain,

## 2016-07-13 NOTE — Progress Notes (Signed)
Patient leaving AMA, signed form and will be leaving hospital

## 2016-07-13 NOTE — ED Provider Notes (Signed)
This patients CHA2DS2-VASc Score and unadjusted Ischemic Stroke Rate (% per year) is equal to 4.8 % stroke rate/year from a score of 4  Above score calculated as 1 point each if present [CHF, HTN, DM, Vascular=MI/PAD/Aortic Plaque, Age if 65-74, or Male] Above score calculated as 2 points each if present [Age > 75, or Stroke/TIA/TE]     David Rhineonald Ellenora Talton, MD 07/13/16 [phone]

## 2016-07-13 NOTE — Progress Notes (Signed)
**Note De-identified Shaunak Kreis Obfuscation** EKG complete:abnormal results reported to RN and placed in patient chart 

## 2016-07-13 NOTE — Progress Notes (Signed)
*  PRELIMINARY RESULTS* Echocardiogram 2D Echocardiogram has been performed.  Stacey DrainWhite, Yesmin Mutch J 07/13/2016, 9:42 AM

## 2016-07-13 NOTE — Consult Note (Signed)
    Patient seen this morning by Ms. Lawrence NP at the request of Dr. Nedra HaiLee. Unfortunately, Mr. David Velasquez has insisted on leaving the hospital and is in the process of signing out AMA. I reviewed his chart but was not able to see him or examine him to discuss the situation and make further recommendations. Please call us back if the situation changes.  Jonelle SidleSamuel G. McDowell, M.D., F.A.C.C.

## 2016-07-13 NOTE — Progress Notes (Signed)
FOLLOW UP EARLIER ADMISSION THIS AM:  82 yo with severe triple V disease, refused any invasive intervention, HTN, HLD, COPD, psoriasis and psoriatic arthritis, admitted for atypical chest pain, developed transient afib with RVR, started on Cardiazem, later stopped after conversion to sinus brady, started heparin drip, and is currently pain free and comfortable.  He is a little hypotensive.  On exam:  BP 90  HR 50's RR 12 AO3, Lungs are clear Corr S1S2 Regular. Abdomen soft and NT. Ext with no edema. Will continue with r/out.  D/C heparin drip. Continue antianginal meds, as he is still refusing interventional cardiology. But given his complicated cardiovascular status, and risk of sudden death, will consult cardiology in case there is any further recommendation.  He confirmed to me again that he rather " die at home than on a table".  Thanks,  Houston SirenPeter Saahas Hidrogo MD FACP. Hospitalist.

## 2016-07-13 NOTE — H&P (Addendum)
History and Physical    David Velasquez ZOX:[id number] DOB: 1931/04/06 DOA: 07/13/2016  PCP: Ignatius Specking, MD   Patient coming from: Home  Chief Complaint: Chest pain   HPI: David Velasquez is a 82 y.o. male with medical history significant for coronary artery disease with severe triple-vessel disease, hypertension, hyperlipidemia, COPD, and psoriasis who presents to the ED with acute onset of chest pain. Patient reports lying awake in bed when he developed a discomfort in the central chest that he suspected was heartburn. He took 4 Tums without relief. Pain began to worsen and the patient became concerned that it might be cardiac and took 324 mg aspirin and a total of 3 nitroglycerin without any change in his symptoms. He describes the pain as constant, moderate to severe in intensity, occurring at rest, with no alleviating or exacerbating factors identified, and without associated dyspnea, nausea, or diaphoresis. Patient reports that the pain is different from what he had experienced a year ago with his NSTEMI, describing it as less severe and the pain had improved with nitroglycerin last year. Patient eventually called EMS for transport to the hospital and was given a fourth sublingual nitroglycerin en route to the hospital without any change in his symptoms. He was evaluated by cardiothoracic surgery during his admission one year ago after coronary angiography revealed severe triple vessel disease. Patient refused any surgery and refused PCI and was discharged with medical management.  ED Course: Upon arrival to the ED, patient is found to be afebrile, saturating adequately on room air, tachycardic in the 120s, and with vitals otherwise stable. EKG reveals an atrial flutter with rate 129 and a nonspecific repolarization abnormality. Chest x-ray findings are consistent with emphysema but there is no acute cardiopulmonary disease evident. Chemistry panel is notable for a BUN of 29 and creatinine 1.71,  up from 1.4-1 year ago. CBC features a stable mild trauma cytopenia with platelets 131,000. Troponin is 0.01. Patient was treated with 2 doses of morphine in the emergency department and reported resolution of his pain. He was started on diltiazem infusion, remained hemodynamically stable and in no apparent respiratory distress, and will be admitted to the stepdown unit for ongoing evaluation and management of chest pain with new atrial flutter with RVR.  Review of Systems:  All other systems reviewed and apart from HPI, are negative.  Past Medical History:  Diagnosis Date  . Arthritis with psoriasis (HCC)   . CAD (coronary artery disease)    cath 05/23/2015 3v dx include 99% prox LAD. Pt referred for CT surgery, absolutely refused any surgery, discussed PCI however also refused, adamant about going home  . COPD (chronic obstructive pulmonary disease) (HCC)   . Hyperlipidemia   . Hypertension   . Psoriasis    Present for greater than 20 years    Past Surgical History:  Procedure Laterality Date  . CARDIAC CATHETERIZATION N/A 05/23/2015   Procedure: Left Heart Cath and Coronary Angiography;  Surgeon: Lyn Records, MD;  Location: [facility] INVASIVE CV LAB;  Service: Cardiovascular;  Laterality: N/A;  . TONSILLECTOMY       reports that he has been smoking Cigarettes.  He started smoking about 68 years ago. He has a 90.00 pack-year smoking history. He has never used smokeless tobacco. He reports that he drinks alcohol. He reports that he does not use drugs.  No Known Allergies  Family History  Problem Relation Age of Onset  . Cancer Mother   . Coronary artery disease Father  Prior to Admission medications   Medication Sig Start Date End Date Taking? Authorizing Provider  albuterol (PROVENTIL HFA;VENTOLIN HFA) 108 (90 BASE) MCG/ACT inhaler Inhale 1 puff into the lungs every 6 (six) hours as needed for wheezing or shortness of breath.   Yes Historical Provider, MD  amLODipine (NORVASC)  2.5 MG tablet Take 1 tablet (2.5 mg total) by mouth daily. 11/27/15  Yes Laqueta LindenSuresh A Koneswaran, MD  aspirin EC 81 MG EC tablet Take 1 tablet (81 mg total) by mouth daily. 05/24/15  Yes Azalee CourseHao Meng, PA  atorvastatin (LIPITOR) 40 MG tablet TAKE 1 TABLET BY MOUTH DAILY AT 6 PM. 06/17/16  Yes Laqueta LindenSuresh A Koneswaran, MD  carvedilol (COREG) 3.125 MG tablet TAKE 1 TABLET BY MOUTH TWICE DAILY WITH A MEAL 06/17/16  Yes Laqueta LindenSuresh A Koneswaran, MD  clopidogrel (PLAVIX) 75 MG tablet TAKE 1 TABLET BY MOUTH EVERY DAY 06/17/16  Yes Laqueta LindenSuresh A Koneswaran, MD  esomeprazole (NEXIUM) 40 MG capsule Take 40 mg by mouth daily at 12 noon.   Yes Historical Provider, MD  magnesium oxide (MAG-OX) 400 MG tablet Take 400 mg by mouth daily.   Yes Historical Provider, MD  nitroGLYCERIN (NITROSTAT) 0.4 MG SL tablet Place 1 tablet (0.4 mg total) under the tongue every 5 (five) minutes x 3 doses as needed for chest pain. 05/24/15  Yes Azalee CourseHao Meng, PA  tamsulosin (FLOMAX) 0.4 MG CAPS capsule Take 0.4 mg by mouth daily.   Yes Historical Provider, MD  co-enzyme Q-10 50 MG capsule Take 50 mg by mouth daily.    Historical Provider, MD  Specialty Vitamins Products (PROSTATE PO) Take 1 capsule by mouth daily.    Historical Provider, MD  triamcinolone cream (KENALOG) 0.1 % Apply 1 application topically daily as needed. rash 04/30/15   Historical Provider, MD    Physical Exam: Vitals:   07/13/16 0104 07/13/16 0130 07/13/16 0230 07/13/16 0300  BP: 131/85 132/91 108/75 133/80  Pulse: (!) 123  (!) 126   Resp: 20  (!) 29 20  Temp: 98.4 F (36.9 C)     TempSrc: Oral     SpO2: 94%  94%   Weight: 90.7 kg (200 lb)     Height: 5\' 10"  (1.778 m)         Constitutional: NAD, calm, comfortable Eyes: PERTLA, lids and conjunctivae normal ENMT: Mucous membranes are moist. Posterior pharynx clear of any exudate or lesions.   Neck: normal, supple, no masses, no thyromegaly Respiratory: clear to auscultation bilaterally, no wheezing, no crackles. Normal respiratory  effort.    Cardiovascular: Rate ~120 and irregular. No carotid bruits. No significant JVD. Abdomen: No distension, no tenderness, no masses palpated. Bowel sounds normal.  Musculoskeletal: no clubbing / cyanosis. No joint deformity upper and lower extremities. Normal muscle tone.  Skin: no significant rashes, lesions, ulcers. Warm, dry, well-perfused. Erythematous plaques with silvery white scale about the face, trunk, and extremities Neurologic: CN 2-12 grossly intact. Sensation intact, DTR normal. Strength 5/5 in all 4 limbs.  Psychiatric: Normal judgment and insight. Alert and oriented x 3. Normal mood and affect.     Labs on Admission: I have personally reviewed following labs and imaging studies  CBC:  Recent Labs Lab 07/13/16 0130  WBC 7.6  HGB 13.1  HCT 39.2  MCV 94.0  PLT 139*   Basic Metabolic Panel:  Recent Labs Lab 07/13/16 0130  NA 136  K 4.5  CL 106  CO2 23  GLUCOSE 135*  BUN 29*  CREATININE 1.71*  CALCIUM  9.4   GFR: Estimated Creatinine Clearance: 37.1 mL/min (by C-G formula based on SCr of 1.71 mg/dL (H)). Liver Function Tests: No results for input(s): AST, ALT, ALKPHOS, BILITOT, PROT, ALBUMIN in the last 168 hours. No results for input(s): LIPASE, AMYLASE in the last 168 hours. No results for input(s): AMMONIA in the last 168 hours. Coagulation Profile: No results for input(s): INR, PROTIME in the last 168 hours. Cardiac Enzymes: No results for input(s): CKTOTAL, CKMB, CKMBINDEX, TROPONINI in the last 168 hours. BNP (last 3 results) No results for input(s): PROBNP in the last 8760 hours. HbA1C: No results for input(s): HGBA1C in the last 72 hours. CBG: No results for input(s): GLUCAP in the last 168 hours. Lipid Profile: No results for input(s): CHOL, HDL, LDLCALC, TRIG, CHOLHDL, LDLDIRECT in the last 72 hours. Thyroid Function Tests: No results for input(s): TSH, T4TOTAL, FREET4, T3FREE, THYROIDAB in the last 72 hours. Anemia Panel: No  results for input(s): VITAMINB12, FOLATE, FERRITIN, TIBC, IRON, RETICCTPCT in the last 72 hours. Urine analysis: No results found for: COLORURINE, APPEARANCEUR, LABSPEC, PHURINE, GLUCOSEU, HGBUR, BILIRUBINUR, KETONESUR, PROTEINUR, UROBILINOGEN, NITRITE, LEUKOCYTESUR Sepsis Labs: @LABRCNTIP (procalcitonin:4,lacticidven:4) )No results found for this or any previous visit (from the past 240 hour(s)).   Radiological Exams on Admission: Dg Chest 2 View  Result Date: 07/13/2016 CLINICAL DATA:  Midsternal chest pain starting about 2 hours ago. EXAM: CHEST  2 VIEW COMPARISON:  05/23/2015 FINDINGS: Normal heart size and pulmonary vascularity. Emphysematous changes in the lungs with scattered fibrosis. No focal airspace disease or consolidation. No blunting of costophrenic angles. No pneumothorax. Calcified aorta. Degenerative changes in the spine and shoulders. IMPRESSION: Emphysematous changes and fibrosis in the lungs. No evidence of active pulmonary disease. Electronically Signed   By: Burman Nieves M.D.   On: 07/13/2016 02:13    EKG: Independently reviewed. Atrial flutter (rate 129), non-specific repolarization abnormality   Assessment/Plan  1. New atrial flutter with RVR - Pt presents with rapid atrial flutter, appears to be his first recorded instance of this  - Chest pain raises concern for ischemic etiology but initial troponin reassuring at 0.01; this is addressed as below  - Check thyroid studies and TTE for potential etiologies  - He was started on diltiazem infusion in ED and this will be titrated for goal rate 60-110  - CHADS-VASc is 45 (age x2, CAD, CHF, HTN)  - Given the concomitant chest pain with known severe triple-vessel disease, will start on [facility] with heparin   ADDENDUM:  At approx 6 am, the patient converted to a sinus bradycardia with rate as low as 38 on the monitor. The patient is completely asymptomatic. Diltiazem infusion was stopped and HR stabilized in mid-high 40s  without sxs. BP had been soft prior to becoming brady and remains stable; will start some IVF and keep atropine at the bedside, to be given for sustained HR <40 or symptomatic bradycardia.    2. Chest pain, CAD   - Pt has known severe triple-vessel disease documented with cath October 2016  - He refused surgery or PCI and has been managed medically  - Chest pain is atypical in the occurrence at rest and no change with NTG; suspected secondary to the tachyarrhythmia  - Initial troponin reassurring at 0.01, will obtain serial measurements  - ASA 324 mg prior to arrival  - Monitor on telemetry, continue ASA, Plavix, Lipitor, Coreg as tolerated; started on heparin infusion in setting of atrial flutter with CHADS-VASc of 5   3. COPD  -  Appears stable on admission  - Continue inhalers   4. Hypertension  - At goal currently  - Continue Coreg and Norvasc as tolerated    5. Chronic systolic CHF  - Appears roughly euvolemic on admission  - EF was 35% on cath from October 2016  - Follow daily wts and I/Os   6. CKD stage III  - SCr is 1.71 on admission with no recent values for comparison; was 1.42 in October 2016  - Avoid nephrotoxins where possible, follow fluid-status    DVT prophylaxis: heparin infusion  Code Status: Full  Family Communication: Discussed with patient Disposition Plan: Admit to stepdown Consults called: None Admission status: Inpatient    David Deutscherimothy S Liesl Simons, MD [facility] Pager [phone]  If 7PM-7AM, please contact night-coverage [email or web address] Password [email or web address]  07/13/2016, 3:21 AM

## 2016-07-13 NOTE — Progress Notes (Signed)
ANTICOAGULATION CONSULT NOTE - Preliminary  Pharmacy Consult for heparin Indication: atrial fibrillation  No Known Allergies  Patient Measurements: Height: 5\' 10"  (177.8 cm) Weight: 216 lb 14.9 oz (98.4 kg) IBW/kg (Calculated) : 73 HEPARIN DW (KG): 93.4   Vital Signs: Temp: 98.1 F (36.7 C) (11/27 0332) Temp Source: Oral (11/27 0332) BP: 133/80 (11/27 0300) Pulse Rate: 126 (11/27 0230)  Labs:  Recent Labs  07/13/16 0130  HGB 13.1  HCT 39.2  PLT 139*  CREATININE 1.71*   Estimated Creatinine Clearance: 38.5 mL/min (by C-G formula based on SCr of 1.71 mg/dL (H)).  Medical History: Past Medical History:  Diagnosis Date  . Arthritis with psoriasis (HCC)   . CAD (coronary artery disease)    cath 05/23/2015 3v dx include 99% prox LAD. Pt referred for CT surgery, absolutely refused any surgery, discussed PCI however also refused, adamant about going home  . COPD (chronic obstructive pulmonary disease) (HCC)   . Hyperlipidemia   . Hypertension   . Psoriasis    Present for greater than 20 years    Medications:  Prescriptions Prior to Admission  Medication Sig Dispense Refill Last Dose  . albuterol (PROVENTIL HFA;VENTOLIN HFA) 108 (90 BASE) MCG/ACT inhaler Inhale 1 puff into the lungs every 6 (six) hours as needed for wheezing or shortness of breath.   Taking  . amLODipine (NORVASC) 2.5 MG tablet Take 1 tablet (2.5 mg total) by mouth daily. 90 tablet 3   . aspirin EC 81 MG EC tablet Take 1 tablet (81 mg total) by mouth daily.   Taking  . atorvastatin (LIPITOR) 40 MG tablet TAKE 1 TABLET BY MOUTH DAILY AT 6 PM. 30 tablet 0   . carvedilol (COREG) 3.125 MG tablet TAKE 1 TABLET BY MOUTH TWICE DAILY WITH A MEAL 60 tablet 0   . clopidogrel (PLAVIX) 75 MG tablet TAKE 1 TABLET BY MOUTH EVERY DAY 30 tablet 0   . esomeprazole (NEXIUM) 40 MG capsule Take 40 mg by mouth daily at 12 noon.     . magnesium oxide (MAG-OX) 400 MG tablet Take 400 mg by mouth daily.   Taking  .  nitroGLYCERIN (NITROSTAT) 0.4 MG SL tablet Place 1 tablet (0.4 mg total) under the tongue every 5 (five) minutes x 3 doses as needed for chest pain. 25 tablet 4 Taking  . tamsulosin (FLOMAX) 0.4 MG CAPS capsule Take 0.4 mg by mouth daily.     Marland Kitchen. co-enzyme Q-10 50 MG capsule Take 50 mg by mouth daily.   Taking  . Specialty Vitamins Products (PROSTATE PO) Take 1 capsule by mouth daily.   Taking  . triamcinolone cream (KENALOG) 0.1 % Apply 1 application topically daily as needed. rash   Taking   Scheduled:  . amLODipine  2.5 mg Oral Daily  . aspirin EC  81 mg Oral Daily  . atorvastatin  40 mg Oral q1800  . carvedilol  3.125 mg Oral BID WC  . clopidogrel  75 mg Oral Daily  . heparin  5,000 Units Intravenous Once  . pantoprazole  40 mg Oral Daily  . tamsulosin  0.4 mg Oral Daily   Infusions:  . diltiazem (CARDIZEM) infusion    . heparin     PRN: acetaminophen, albuterol, ALPRAZolam, ondansetron (ZOFRAN) IV Anti-infectives    None      Assessment: 82 yo male admitted with chest pain. Found to have atrial fibrillation and starting heparin.   Goal of Therapy:  Heparin level 0.3-0.7 units/ml   Plan:  Give  5000 units bolus x 1 Start heparin infusion at 1300 units/hr Check anti-Xa level in 8 hours and daily while on heparin Continue to monitor H&H and platelets Preliminary review of pertinent patient information completed.  Jeani HawkingAnnie Penn clinical pharmacist will complete review during morning rounds to assess the patient and finalize treatment regimen.  David Velasquez, RPH 07/13/2016,3:57 AM

## 2016-07-13 NOTE — ED Provider Notes (Signed)
[facility]-EMERGENCY DEPT Provider Note   CSN: [id number] Arrival date & time: 07/13/16  0054     History   Chief Complaint Chief Complaint  Patient presents with  . Chest Pain    HPI David Velasquez is a 82 y.o. male.  The history is provided by the patient and a relative.  Chest Pain   This is a new problem. The current episode started 1 to 2 hours ago. The problem occurs constantly. The problem has not changed since onset.The pain is present in the substernal region. The pain is moderate. Quality: "it hurts" The pain does not radiate. Associated symptoms include shortness of breath. Pertinent negatives include no abdominal pain, no fever and no vomiting. He has tried nitroglycerin (ASA) for the symptoms. Risk factors include being elderly.  His past medical history is significant for CAD.  Patient with h/o COPD, h/o CAD (3 vessel disease and has refused surgery) presents with CP onset about 2 hrs ago.  Similar to prior episodes of CP.  He also reports feeling short of breath Prior to onset of pain he was otherwise well/at baseline   Past Medical History:  Diagnosis Date  . Arthritis with psoriasis (HCC)   . CAD (coronary artery disease)    cath 05/23/2015 3v dx include 99% prox LAD. Pt referred for CT surgery, absolutely refused any surgery, discussed PCI however also refused, adamant about going home  . COPD (chronic obstructive pulmonary disease) (HCC)   . Hyperlipidemia   . Hypertension   . Psoriasis    Present for greater than 20 years    Patient Active Problem List   Diagnosis Date Noted  . NSTEMI (non-ST elevated myocardial infarction) (HCC) 05/23/2015  . COPD exacerbation (HCC) 05/23/2015  . Essential hypertension 05/23/2015  . Tobacco use disorder 05/23/2015  . Back pain 05/23/2015  . Chest pain 05/23/2015    Past Surgical History:  Procedure Laterality Date  . CARDIAC CATHETERIZATION N/A 05/23/2015   Procedure: Left Heart Cath and Coronary Angiography;   Surgeon: Lyn RecordsHenry W Smith, MD;  [facility]VAS[facility] LAB;  Service: Cardiovascular;  Laterality: N/A;  . TONSILLECTOMY         Home Medications    Prior to Admission medications   Medication Sig Start Date End Date Taking? Authorizing Provider  albuterol (PROVENTIL HFA;VENTOLIN HFA) 108 (90 BASE) MCG/ACT inhaler Inhale 1 puff into the lungs every 6 (six) hours as needed for wheezing or shortness of breath.   Yes Historical Provider, MD  amLODipine (NORVASC) 2.5 MG tablet Take 1 tablet (2.5 mg total) by mouth daily. 11/27/15  Yes Laqueta LindenSuresh A Koneswaran, MD  aspirin EC 81 MG EC tablet Take 1 tablet (81 mg total) by mouth daily. 05/24/15  Yes Azalee CourseHao Meng, PA  atorvastatin (LIPITOR) 40 MG tablet TAKE 1 TABLET BY MOUTH DAILY AT 6 PM. 06/17/16  Yes Laqueta LindenSuresh A Koneswaran, MD  carvedilol (COREG) 3.125 MG tablet TAKE 1 TABLET BY MOUTH TWICE DAILY WITH A MEAL 06/17/16  Yes Laqueta LindenSuresh A Koneswaran, MD  clopidogrel (PLAVIX) 75 MG tablet TAKE 1 TABLET BY MOUTH EVERY DAY 06/17/16  Yes Laqueta LindenSuresh A Koneswaran, MD  esomeprazole (NEXIUM) 40 MG capsule Take 40 mg by mouth daily at 12 noon.   Yes Historical Provider, MD  magnesium oxide (MAG-OX) 400 MG tablet Take 400 mg by mouth daily.   Yes Historical Provider, MD  nitroGLYCERIN (NITROSTAT) 0.4 MG SL tablet Place 1 tablet (0.4 mg total) under the tongue every 5 (five) minutes x 3 doses  as needed for chest pain. 05/24/15  Yes Azalee CourseHao Meng, PA  tamsulosin (FLOMAX) 0.4 MG CAPS capsule Take 0.4 mg by mouth daily.   Yes Historical Provider, MD  co-enzyme Q-10 50 MG capsule Take 50 mg by mouth daily.    Historical Provider, MD  Specialty Vitamins Products (PROSTATE PO) Take 1 capsule by mouth daily.    Historical Provider, MD  triamcinolone cream (KENALOG) 0.1 % Apply 1 application topically daily as needed. rash 04/30/15   Historical Provider, MD    Family History Family History  Problem Relation Age of Onset  . Cancer Mother   . Coronary artery disease Father     Social  History Social History  Substance Use Topics  . Smoking status: Current Every Day Smoker    Packs/day: 1.50    Years: 60.00    Types: Cigarettes    Start date: 10/20/1947  . Smokeless tobacco: Never Used  . Alcohol use 0.0 oz/week     Comment: On occasion but not on a daily basis     Allergies   Patient has no known allergies.   Review of Systems Review of Systems  Constitutional: Negative for fever.  Respiratory: Positive for shortness of breath.   Cardiovascular: Positive for chest pain. Negative for leg swelling.  Gastrointestinal: Negative for abdominal pain and vomiting.  All other systems reviewed and are negative.    Physical Exam Updated Vital Signs BP 131/85   Pulse (!) 123   Temp 98.4 F (36.9 C) (Oral)   Resp 20   Ht 5\' 10"  (1.778 m)   Wt 90.7 kg   SpO2 94%   BMI 28.70 kg/m   Physical Exam CONSTITUTIONAL: Elderly, no acute distress HEAD: Normocephalic/atraumatic EYES: EOMI ENMT: Mucous membranes moist NECK: supple no meningeal signs SPINE/BACK:entire spine nontender CV: tachycardic, irregular, no loud murmurs noted LUNGS:wheezing bilaterally, no distress noted ABDOMEN: soft, nontender GU:no cva tenderness NEURO: Pt is awake/alert/appropriate, moves all extremitiesx4.  No facial droop.   EXTREMITIES: pulses normal/equalx4, full ROM, no LE edema noted SKIN: warm, color normal PSYCH: flat affect ED Treatments / Results  Labs (all labs ordered are listed, but only abnormal results are displayed) Labs Reviewed  BASIC METABOLIC PANEL - Abnormal; Notable for the following:       Result Value   Glucose, Bld 135 (*)    BUN 29 (*)    Creatinine, Ser 1.71 (*)    GFR calc non Af Amer 35 (*)    GFR calc Af Amer 41 (*)    All other components within normal limits  CBC - Abnormal; Notable for the following:    RBC 4.17 (*)    Platelets 139 (*)    All other components within normal limits  I-STAT TROPOININ, ED    EKG  EKG  Interpretation  Date/Time:  Monday July 13 2016 00:58:14 EST Ventricular Rate:  129 PR Interval:    QRS Duration: 96 QT Interval:  322 QTC Calculation: 472 R Axis:   38 Text Interpretation:  Atrial flutter with predominant 2:1 AV block Repolarization abnormality, prob rate related changed from prior Confirmed by Bebe ShaggyWICKLINE  MD, Ophie Burrowes ([id number]) on 07/13/2016 1:04:26 AM       Radiology Dg Chest 2 View  Result Date: 07/13/2016 CLINICAL DATA:  Midsternal chest pain starting about 2 hours ago. EXAM: CHEST  2 VIEW COMPARISON:  05/23/2015 FINDINGS: Normal heart size and pulmonary vascularity. Emphysematous changes in the lungs with scattered fibrosis. No focal airspace disease or consolidation. No blunting  of costophrenic angles. No pneumothorax. Calcified aorta. Degenerative changes in the spine and shoulders. IMPRESSION: Emphysematous changes and fibrosis in the lungs. No evidence of active pulmonary disease. Electronically Signed   By: Burman Nieves M.D.   On: 07/13/2016 02:13    Procedures Procedures  CRITICAL CARE Performed by: Joya Gaskins Total critical care time: 33 minutes Critical care time was exclusive of separately billable procedures and treating other patients. Critical care was necessary to treat or prevent imminent or life-threatening deterioration. Critical care was time spent personally by me on the following activities: development of treatment plan with patient and/or surrogate as well as nursing, discussions with consultants, evaluation of patient's response to treatment, examination of patient, obtaining history from patient or surrogate, ordering and performing treatments and interventions, ordering and review of laboratory studies, ordering and review of radiographic studies, pulse oximetry and re-evaluation of patient's condition. PATIENT WITH NEW ONSET ATRIAL FLUTTER WITH RAPID HEART RATE REQUIRING IV CARDIZEM DRIP TO CONTROL HIS RATE  Medications Ordered  in ED Medications  diltiazem (CARDIZEM) 100 mg in dextrose 5 % 100 mL (1 mg/mL) infusion (10 mg/hr Intravenous Rate/Dose Change 07/13/16 0153)  morphine 2 MG/ML injection 2 mg (2 mg Intravenous Given 07/13/16 0121)  morphine 4 MG/ML injection 4 mg (4 mg Intravenous Given 07/13/16 0212)     Initial Impression / Assessment and Plan / ED Course  I have reviewed the triage vital signs and the nursing notes.  Pertinent labs & imaging results that were available during my care of the patient were reviewed by me and considered in my medical decision making (see chart for details).  Clinical Course     1:30 AM Pt here with CP EKG appears to atrial flutter, unknown time of onset, no known previous diagnosis Imaging/labs pending cardizem started for patient 2:41 AM  HR STILL HIGH, BUT OVERALL PATIENT IMPROVED AND WELL APPEARING WITH SOME RESIDUAL CP UNKNOWN TIME OF ONSET OF A-FLUTTER, WILL NEED CARDIZEM DRIP AND MONITORING HE REPORTS CP SIMILAR TO PRIOR ANGINA I DOUBT DISSECTION/PE AT THIS TIME D/W DR OPYD FOR ADMIT Final Clinical Impressions(s) / ED Diagnoses   Final diagnoses:  Atrial flutter with rapid ventricular response (HCC)  Chest pain, rule out acute myocardial infarction    New Prescriptions New Prescriptions   No medications on file     Zadie Rhine, MD 07/13/16 [phone]

## 2016-07-13 NOTE — Discharge Summary (Signed)
Physician Discharge Summary  David KentConnie M Velasquez WGN:[id number] DOB: 09/28/1930 DOA: 07/13/2016  PCP: David Speckinghruv B Vyas, MD  Admit date: 07/13/2016 Discharge date: 07/13/2016  Admitted From: Home.  Disposition:  Home.   Recommendations for Outpatient Follow-up:  1. Follow up with PCP in 1-2 weeks 2. Please obtain BMP/CBC in one week 3. Please follow up on the following pending results:    Brief/Interim Summary:   82 yo with severe triple V disease, refused any invasive intervention, HTN, HLD, COPD, psoriasis and psoriatic arthritis, admitted for atypical chest pain, developed transient afib with RVR, started on Cardiazem, later stopped after conversion to sinus brady, started heparin drip, and is currently pain free and comfortable.  He is a little hypotensive.  On exam:  BP 90  HR 50's RR 12 AO3, Lungs are clear Corr S1S2 Regular. Abdomen soft and NT. Ext with no edema. Will continue with r/out.  D/C heparin drip. Continue antianginal meds, as he is still refusing interventional cardiology. But given his complicated cardiovascular status, and risk of sudden death, will consult cardiology in case there is any further recommendation.  He confirmed to me again that he rather " die at home than on a table". He decided to leave AMA, and deemed competent to make his own decision, he was discharged AMA.   Discharge Diagnoses:  Principal Problem:   Atrial flutter with rapid ventricular response (HCC) Active Problems:   COPD (chronic obstructive pulmonary disease) (HCC)   Essential hypertension   Chest pain   CKD (chronic kidney disease), stage III   Chest pain, rule out acute myocardial infarction   Chronic systolic CHF (congestive heart failure) (HCC)    Discharge Instructions:  To follow up with PCP and Cardiology outpatient.    No Known Allergies  Consultations:  Cardiology.    Procedures/Studies: Dg Chest 2 View  Result Date: 07/13/2016 CLINICAL DATA:  Midsternal chest pain  starting about 2 hours ago. EXAM: CHEST  2 VIEW COMPARISON:  05/23/2015 FINDINGS: Normal heart size and pulmonary vascularity. Emphysematous changes in the lungs with scattered fibrosis. No focal airspace disease or consolidation. No blunting of costophrenic angles. No pneumothorax. Calcified aorta. Degenerative changes in the spine and shoulders. IMPRESSION: Emphysematous changes and fibrosis in the lungs. No evidence of active pulmonary disease. Electronically Signed   By: Burman NievesWilliam  Stevens M.D.   On: 07/13/2016 02:13      Subjective: No pain.  Felt better.   Discharge Exam: Vitals:   07/13/16 1200 07/13/16 1300  BP:    Pulse: (!) 56 (!) 49  Resp: (!) 23 14  Temp:     Vitals:   07/13/16 1000 07/13/16 1100 07/13/16 1200 07/13/16 1300  BP:      Pulse: (!) 49 (!) 48 (!) 56 (!) 49  Resp: 17 13 (!) 23 14  Temp:      TempSrc:      SpO2: 97% 97% 97% 94%  Weight:      Height:        General: Pt is alert, awake, not in acute distress Cardiovascular: RRR, S1/S2 +, no rubs, no gallops Respiratory: CTA bilaterally, no wheezing, no rhonchi Abdominal: Soft, NT, ND, bowel sounds + Extremities: no edema, no cyanosis    The results of significant diagnostics from this hospitalization (including imaging, microbiology, ancillary and laboratory) are listed below for reference.     Microbiology: Recent Results (from the past 240 hour(s))  MRSA PCR Screening     Status: None   Collection Time: 07/13/16  3:25 AM  Result Value Ref Range Status   MRSA by PCR NEGATIVE NEGATIVE Final    Comment:        The GeneXpert MRSA Assay (FDA approved for NASAL specimens only), is one component of a comprehensive MRSA colonization surveillance program. It is not intended to diagnose MRSA infection nor to guide or monitor treatment for MRSA infections.      Labs: BNP (last 3 results) No results for input(s): BNP in the last 8760 hours. Basic Metabolic Panel:  Recent Labs Lab 07/13/16 0130   NA 136  K 4.5  CL 106  CO2 23  GLUCOSE 135*  BUN 29*  CREATININE 1.71*  CALCIUM 9.4   CBC:  Recent Labs Lab 07/13/16 0130  WBC 7.6  HGB 13.1  HCT 39.2  MCV 94.0  PLT 139*   Cardiac Enzymes:  Recent Labs Lab 07/13/16 0347 07/13/16 1009  TROPONINI 0.05* 0.57*   Thyroid function studies  Recent Labs  07/13/16 0130  TSH 1.253   Microbiology Recent Results (from the past 240 hour(s))  MRSA PCR Screening     Status: None   Collection Time: 07/13/16  3:25 AM  Result Value Ref Range Status   MRSA by PCR NEGATIVE NEGATIVE Final    Comment:        The GeneXpert MRSA Assay (FDA approved for NASAL specimens only), is one component of a comprehensive MRSA colonization surveillance program. It is not intended to diagnose MRSA infection nor to guide or monitor treatment for MRSA infections.      Time coordinating discharge: Over 30 minutes  SIGNED:   Houston SirenLE,Calisha Tindel, MD FACP [facility] 07/13/2016, 4:14 PM   If 7PM-7AM, please contact night-coverage [email or web address] Password [email or web address]

## 2016-07-17 ENCOUNTER — Other Ambulatory Visit: Payer: Self-pay | Admitting: Cardiovascular Disease

## 2016-08-17 ENCOUNTER — Other Ambulatory Visit: Payer: Self-pay | Admitting: Cardiovascular Disease

## 2016-09-15 ENCOUNTER — Other Ambulatory Visit: Payer: Self-pay | Admitting: Cardiovascular Disease

## 2016-10-27 ENCOUNTER — Other Ambulatory Visit: Payer: Self-pay | Admitting: Cardiovascular Disease

## 2016-12-11 ENCOUNTER — Other Ambulatory Visit: Payer: Self-pay | Admitting: Cardiovascular Disease

## 2016-12-14 ENCOUNTER — Other Ambulatory Visit: Payer: Self-pay | Admitting: Cardiovascular Disease

## 2017-04-15 IMAGING — DX DG CHEST 2V
2 series · 2 of 2 positions shown · non-contrast
Comparison: 05/23/2015

CLINICAL DATA: Midsternal chest pain starting about 2 hours ago.

EXAM:
CHEST  2 VIEW

[chest lat]
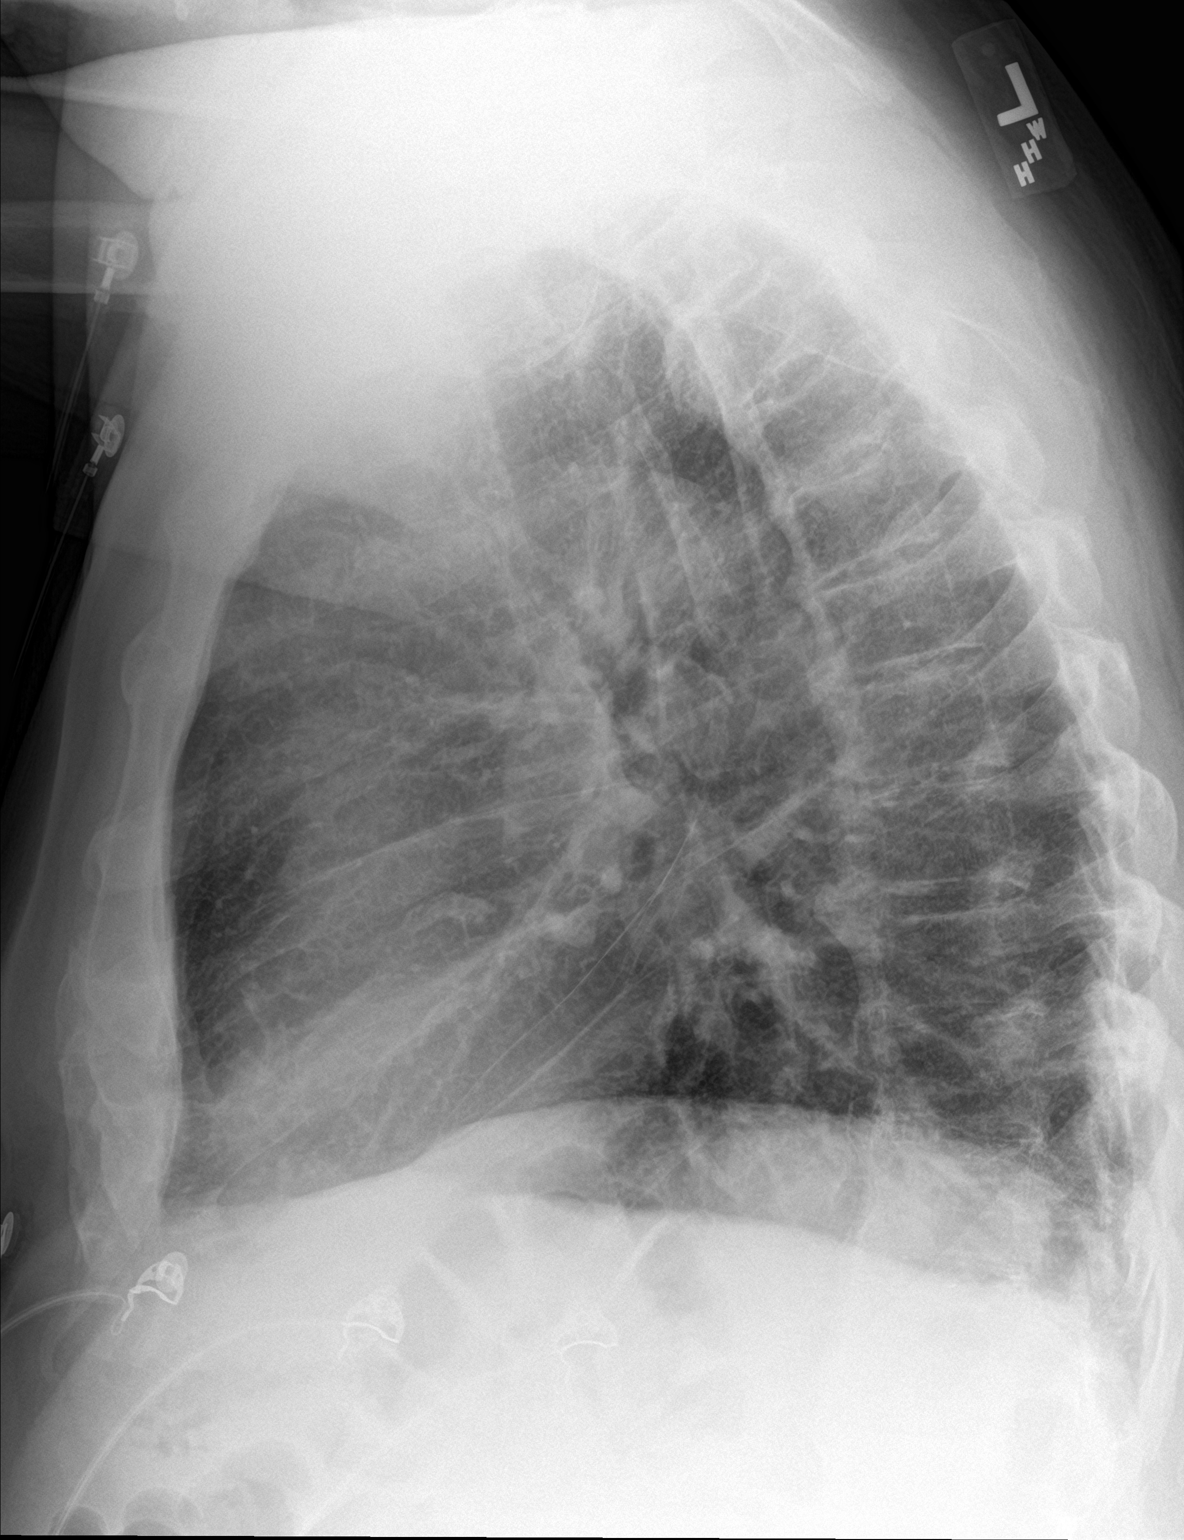

[chest pa]
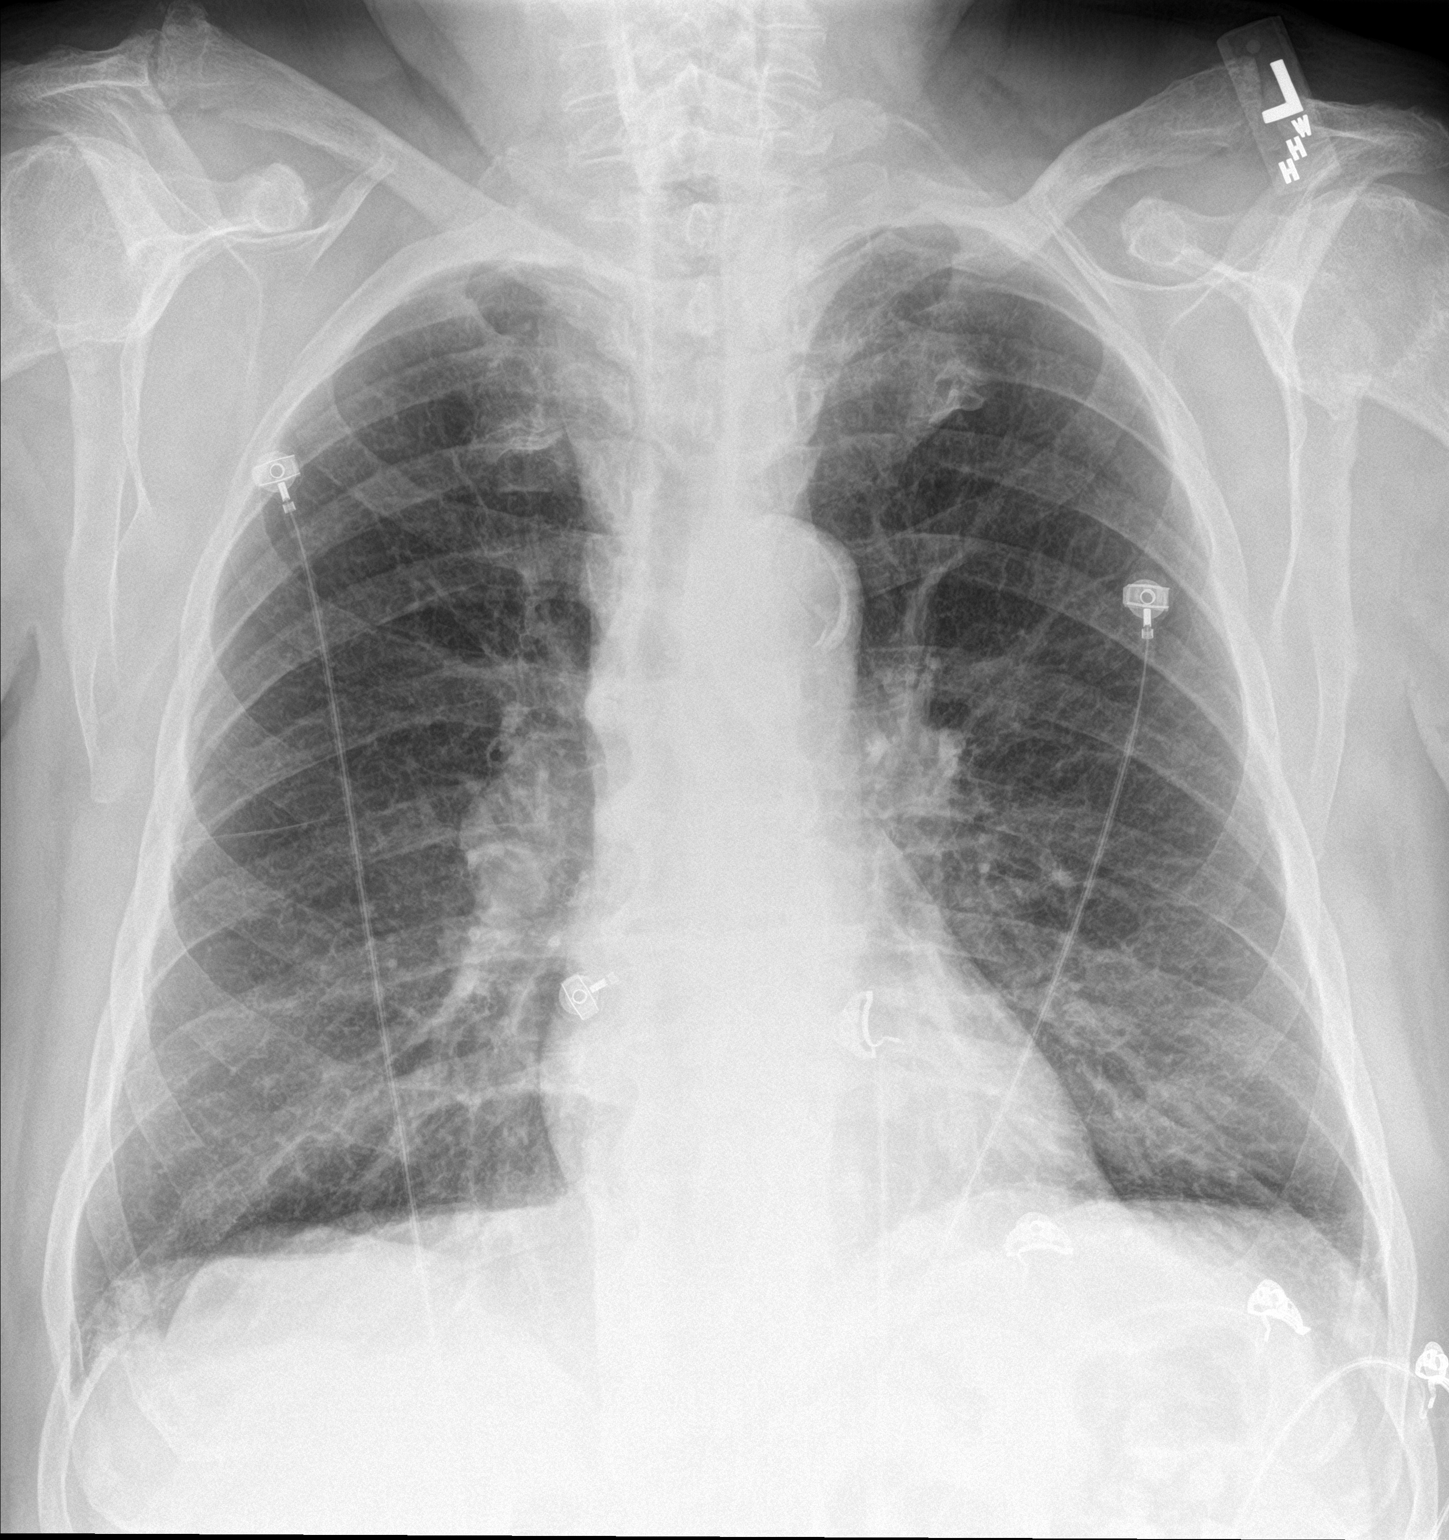

[2 of 2 positions shown; findings below may reference images not displayed]

FINDINGS: Normal heart size and pulmonary vascularity. Emphysematous changes
in the lungs with scattered fibrosis. No focal airspace disease or
consolidation. No blunting of costophrenic angles. No pneumothorax.
Calcified aorta. Degenerative changes in the spine and shoulders.
IMPRESSION: Emphysematous changes and fibrosis in the lungs. No evidence of
active pulmonary disease.

## 2017-06-17 DEATH — deceased
